# Patient Record
Sex: Male | Born: 1945 | Hispanic: No | Marital: Married
Health system: Southern US, Community
[De-identification: ages and names within clinical notes are randomized; demographics above are authoritative.]

---

## 2006-02-16 ENCOUNTER — Ambulatory Visit: Payer: Self-pay | Admitting: Cardiology

## 2006-02-16 ENCOUNTER — Ambulatory Visit (HOSPITAL_COMMUNITY): Admission: RE | Admit: 2006-02-16 | Discharge: 2006-02-16 | Payer: Self-pay | Admitting: Cardiology

## 2011-09-09 DIAGNOSIS — I499 Cardiac arrhythmia, unspecified: Secondary | ICD-10-CM | POA: Diagnosis not present

## 2011-09-09 DIAGNOSIS — Z23 Encounter for immunization: Secondary | ICD-10-CM | POA: Diagnosis not present

## 2011-09-09 DIAGNOSIS — R9431 Abnormal electrocardiogram [ECG] [EKG]: Secondary | ICD-10-CM | POA: Diagnosis not present

## 2011-09-24 DIAGNOSIS — Z23 Encounter for immunization: Secondary | ICD-10-CM | POA: Diagnosis not present

## 2011-09-24 DIAGNOSIS — E782 Mixed hyperlipidemia: Secondary | ICD-10-CM | POA: Diagnosis not present

## 2011-09-24 DIAGNOSIS — I2510 Atherosclerotic heart disease of native coronary artery without angina pectoris: Secondary | ICD-10-CM | POA: Diagnosis not present

## 2011-09-24 DIAGNOSIS — K2100 Gastro-esophageal reflux disease with esophagitis, without bleeding: Secondary | ICD-10-CM | POA: Diagnosis not present

## 2011-09-24 DIAGNOSIS — I428 Other cardiomyopathies: Secondary | ICD-10-CM | POA: Diagnosis not present

## 2011-10-21 DIAGNOSIS — H60509 Unspecified acute noninfective otitis externa, unspecified ear: Secondary | ICD-10-CM | POA: Diagnosis not present

## 2012-01-20 DIAGNOSIS — H6120 Impacted cerumen, unspecified ear: Secondary | ICD-10-CM | POA: Diagnosis not present

## 2012-01-20 DIAGNOSIS — Z1331 Encounter for screening for depression: Secondary | ICD-10-CM | POA: Diagnosis not present

## 2012-01-20 DIAGNOSIS — H9319 Tinnitus, unspecified ear: Secondary | ICD-10-CM | POA: Diagnosis not present

## 2012-01-20 DIAGNOSIS — H60509 Unspecified acute noninfective otitis externa, unspecified ear: Secondary | ICD-10-CM | POA: Diagnosis not present

## 2012-01-25 DIAGNOSIS — E782 Mixed hyperlipidemia: Secondary | ICD-10-CM | POA: Diagnosis not present

## 2012-01-25 DIAGNOSIS — I428 Other cardiomyopathies: Secondary | ICD-10-CM | POA: Diagnosis not present

## 2012-02-11 DIAGNOSIS — R7401 Elevation of levels of liver transaminase levels: Secondary | ICD-10-CM | POA: Diagnosis not present

## 2012-03-22 DIAGNOSIS — R062 Wheezing: Secondary | ICD-10-CM | POA: Diagnosis not present

## 2012-03-22 DIAGNOSIS — I959 Hypotension, unspecified: Secondary | ICD-10-CM | POA: Diagnosis not present

## 2012-03-22 DIAGNOSIS — J209 Acute bronchitis, unspecified: Secondary | ICD-10-CM | POA: Diagnosis not present

## 2012-04-12 DIAGNOSIS — H6690 Otitis media, unspecified, unspecified ear: Secondary | ICD-10-CM | POA: Diagnosis present

## 2012-04-12 DIAGNOSIS — I2510 Atherosclerotic heart disease of native coronary artery without angina pectoris: Secondary | ICD-10-CM | POA: Diagnosis present

## 2012-04-12 DIAGNOSIS — I4949 Other premature depolarization: Secondary | ICD-10-CM | POA: Diagnosis not present

## 2012-04-12 DIAGNOSIS — Z823 Family history of stroke: Secondary | ICD-10-CM | POA: Diagnosis not present

## 2012-04-12 DIAGNOSIS — I6340 Cerebral infarction due to embolism of unspecified cerebral artery: Secondary | ICD-10-CM | POA: Diagnosis present

## 2012-04-12 DIAGNOSIS — I428 Other cardiomyopathies: Secondary | ICD-10-CM | POA: Diagnosis present

## 2012-04-12 DIAGNOSIS — E785 Hyperlipidemia, unspecified: Secondary | ICD-10-CM | POA: Diagnosis not present

## 2012-04-12 DIAGNOSIS — R059 Cough, unspecified: Secondary | ICD-10-CM | POA: Diagnosis not present

## 2012-04-12 DIAGNOSIS — I369 Nonrheumatic tricuspid valve disorder, unspecified: Secondary | ICD-10-CM | POA: Diagnosis not present

## 2012-04-12 DIAGNOSIS — R29898 Other symptoms and signs involving the musculoskeletal system: Secondary | ICD-10-CM | POA: Diagnosis not present

## 2012-04-12 DIAGNOSIS — R29810 Facial weakness: Secondary | ICD-10-CM | POA: Diagnosis present

## 2012-04-12 DIAGNOSIS — I10 Essential (primary) hypertension: Secondary | ICD-10-CM | POA: Diagnosis not present

## 2012-04-12 DIAGNOSIS — R05 Cough: Secondary | ICD-10-CM | POA: Diagnosis not present

## 2012-04-12 DIAGNOSIS — I6350 Cerebral infarction due to unspecified occlusion or stenosis of unspecified cerebral artery: Secondary | ICD-10-CM | POA: Diagnosis not present

## 2012-04-12 DIAGNOSIS — R4789 Other speech disturbances: Secondary | ICD-10-CM | POA: Diagnosis present

## 2012-04-12 DIAGNOSIS — R279 Unspecified lack of coordination: Secondary | ICD-10-CM | POA: Diagnosis present

## 2012-04-18 DIAGNOSIS — I4891 Unspecified atrial fibrillation: Secondary | ICD-10-CM | POA: Diagnosis not present

## 2012-04-18 DIAGNOSIS — I69992 Facial weakness following unspecified cerebrovascular disease: Secondary | ICD-10-CM | POA: Diagnosis not present

## 2012-04-22 DIAGNOSIS — IMO0001 Reserved for inherently not codable concepts without codable children: Secondary | ICD-10-CM | POA: Diagnosis not present

## 2012-04-22 DIAGNOSIS — I69959 Hemiplegia and hemiparesis following unspecified cerebrovascular disease affecting unspecified side: Secondary | ICD-10-CM | POA: Diagnosis not present

## 2012-04-27 DIAGNOSIS — I4891 Unspecified atrial fibrillation: Secondary | ICD-10-CM | POA: Diagnosis not present

## 2012-04-27 DIAGNOSIS — I10 Essential (primary) hypertension: Secondary | ICD-10-CM | POA: Diagnosis not present

## 2012-04-27 DIAGNOSIS — I2510 Atherosclerotic heart disease of native coronary artery without angina pectoris: Secondary | ICD-10-CM | POA: Diagnosis not present

## 2012-04-27 DIAGNOSIS — I428 Other cardiomyopathies: Secondary | ICD-10-CM | POA: Diagnosis not present

## 2012-04-27 DIAGNOSIS — I6350 Cerebral infarction due to unspecified occlusion or stenosis of unspecified cerebral artery: Secondary | ICD-10-CM | POA: Diagnosis not present

## 2012-04-28 DIAGNOSIS — K645 Perianal venous thrombosis: Secondary | ICD-10-CM | POA: Diagnosis not present

## 2012-04-28 DIAGNOSIS — H608X9 Other otitis externa, unspecified ear: Secondary | ICD-10-CM | POA: Diagnosis not present

## 2012-04-29 DIAGNOSIS — H9209 Otalgia, unspecified ear: Secondary | ICD-10-CM | POA: Diagnosis not present

## 2012-04-29 DIAGNOSIS — H9319 Tinnitus, unspecified ear: Secondary | ICD-10-CM | POA: Diagnosis not present

## 2012-04-29 DIAGNOSIS — H60399 Other infective otitis externa, unspecified ear: Secondary | ICD-10-CM | POA: Diagnosis not present

## 2012-05-03 DIAGNOSIS — H60399 Other infective otitis externa, unspecified ear: Secondary | ICD-10-CM | POA: Diagnosis not present

## 2012-05-03 DIAGNOSIS — H9190 Unspecified hearing loss, unspecified ear: Secondary | ICD-10-CM | POA: Diagnosis not present

## 2012-05-09 DIAGNOSIS — R002 Palpitations: Secondary | ICD-10-CM | POA: Diagnosis not present

## 2012-05-09 DIAGNOSIS — I499 Cardiac arrhythmia, unspecified: Secondary | ICD-10-CM | POA: Diagnosis not present

## 2012-05-09 DIAGNOSIS — I4891 Unspecified atrial fibrillation: Secondary | ICD-10-CM | POA: Diagnosis not present

## 2012-05-09 DIAGNOSIS — R791 Abnormal coagulation profile: Secondary | ICD-10-CM | POA: Diagnosis not present

## 2012-05-09 DIAGNOSIS — Z7901 Long term (current) use of anticoagulants: Secondary | ICD-10-CM | POA: Diagnosis not present

## 2012-05-09 DIAGNOSIS — D689 Coagulation defect, unspecified: Secondary | ICD-10-CM | POA: Diagnosis not present

## 2012-05-12 DIAGNOSIS — I4891 Unspecified atrial fibrillation: Secondary | ICD-10-CM | POA: Diagnosis not present

## 2012-05-12 DIAGNOSIS — I495 Sick sinus syndrome: Secondary | ICD-10-CM | POA: Diagnosis not present

## 2012-05-13 DIAGNOSIS — I2510 Atherosclerotic heart disease of native coronary artery without angina pectoris: Secondary | ICD-10-CM | POA: Diagnosis not present

## 2012-05-13 DIAGNOSIS — I4891 Unspecified atrial fibrillation: Secondary | ICD-10-CM | POA: Diagnosis not present

## 2012-05-13 DIAGNOSIS — I428 Other cardiomyopathies: Secondary | ICD-10-CM | POA: Diagnosis not present

## 2012-05-13 DIAGNOSIS — I498 Other specified cardiac arrhythmias: Secondary | ICD-10-CM | POA: Diagnosis not present

## 2012-05-18 DIAGNOSIS — I4891 Unspecified atrial fibrillation: Secondary | ICD-10-CM | POA: Diagnosis not present

## 2012-05-18 DIAGNOSIS — H903 Sensorineural hearing loss, bilateral: Secondary | ICD-10-CM | POA: Diagnosis not present

## 2012-05-18 DIAGNOSIS — H9319 Tinnitus, unspecified ear: Secondary | ICD-10-CM | POA: Diagnosis not present

## 2012-05-23 DIAGNOSIS — I4891 Unspecified atrial fibrillation: Secondary | ICD-10-CM | POA: Diagnosis not present

## 2012-05-23 DIAGNOSIS — Z23 Encounter for immunization: Secondary | ICD-10-CM | POA: Diagnosis not present

## 2012-05-30 DIAGNOSIS — I517 Cardiomegaly: Secondary | ICD-10-CM | POA: Diagnosis not present

## 2012-05-30 DIAGNOSIS — I499 Cardiac arrhythmia, unspecified: Secondary | ICD-10-CM | POA: Diagnosis not present

## 2012-06-06 DIAGNOSIS — I4891 Unspecified atrial fibrillation: Secondary | ICD-10-CM | POA: Diagnosis not present

## 2012-06-20 DIAGNOSIS — Z7901 Long term (current) use of anticoagulants: Secondary | ICD-10-CM | POA: Diagnosis not present

## 2012-06-20 DIAGNOSIS — I4891 Unspecified atrial fibrillation: Secondary | ICD-10-CM | POA: Diagnosis not present

## 2012-06-27 DIAGNOSIS — Z7901 Long term (current) use of anticoagulants: Secondary | ICD-10-CM | POA: Diagnosis not present

## 2012-06-27 DIAGNOSIS — I4891 Unspecified atrial fibrillation: Secondary | ICD-10-CM | POA: Diagnosis not present

## 2012-07-04 DIAGNOSIS — I4891 Unspecified atrial fibrillation: Secondary | ICD-10-CM | POA: Diagnosis not present

## 2012-07-04 DIAGNOSIS — I428 Other cardiomyopathies: Secondary | ICD-10-CM | POA: Diagnosis not present

## 2012-07-11 DIAGNOSIS — Z7901 Long term (current) use of anticoagulants: Secondary | ICD-10-CM | POA: Diagnosis not present

## 2012-07-11 DIAGNOSIS — I4891 Unspecified atrial fibrillation: Secondary | ICD-10-CM | POA: Diagnosis not present

## 2012-07-13 DIAGNOSIS — H9190 Unspecified hearing loss, unspecified ear: Secondary | ICD-10-CM | POA: Diagnosis not present

## 2012-07-13 DIAGNOSIS — H9319 Tinnitus, unspecified ear: Secondary | ICD-10-CM | POA: Diagnosis not present

## 2012-07-18 DIAGNOSIS — R0989 Other specified symptoms and signs involving the circulatory and respiratory systems: Secondary | ICD-10-CM | POA: Diagnosis not present

## 2012-07-18 DIAGNOSIS — R0609 Other forms of dyspnea: Secondary | ICD-10-CM | POA: Diagnosis not present

## 2012-07-18 DIAGNOSIS — I4891 Unspecified atrial fibrillation: Secondary | ICD-10-CM | POA: Diagnosis not present

## 2012-07-18 DIAGNOSIS — F411 Generalized anxiety disorder: Secondary | ICD-10-CM | POA: Diagnosis not present

## 2012-07-18 DIAGNOSIS — I10 Essential (primary) hypertension: Secondary | ICD-10-CM | POA: Diagnosis not present

## 2012-07-18 DIAGNOSIS — R0602 Shortness of breath: Secondary | ICD-10-CM | POA: Diagnosis not present

## 2012-07-18 DIAGNOSIS — R5381 Other malaise: Secondary | ICD-10-CM | POA: Diagnosis not present

## 2012-07-18 DIAGNOSIS — R5383 Other fatigue: Secondary | ICD-10-CM | POA: Diagnosis not present

## 2012-07-18 DIAGNOSIS — I509 Heart failure, unspecified: Secondary | ICD-10-CM | POA: Diagnosis not present

## 2012-07-18 DIAGNOSIS — Z7901 Long term (current) use of anticoagulants: Secondary | ICD-10-CM | POA: Diagnosis not present

## 2012-07-28 DIAGNOSIS — H903 Sensorineural hearing loss, bilateral: Secondary | ICD-10-CM | POA: Diagnosis not present

## 2012-07-28 DIAGNOSIS — H9319 Tinnitus, unspecified ear: Secondary | ICD-10-CM | POA: Diagnosis not present

## 2012-08-08 DIAGNOSIS — I4891 Unspecified atrial fibrillation: Secondary | ICD-10-CM | POA: Diagnosis not present

## 2012-08-08 DIAGNOSIS — Z7901 Long term (current) use of anticoagulants: Secondary | ICD-10-CM | POA: Diagnosis not present

## 2012-08-12 DIAGNOSIS — I4891 Unspecified atrial fibrillation: Secondary | ICD-10-CM | POA: Diagnosis not present

## 2012-08-12 DIAGNOSIS — R609 Edema, unspecified: Secondary | ICD-10-CM | POA: Diagnosis not present

## 2012-08-29 DIAGNOSIS — I4891 Unspecified atrial fibrillation: Secondary | ICD-10-CM | POA: Diagnosis not present

## 2012-08-29 DIAGNOSIS — Z7901 Long term (current) use of anticoagulants: Secondary | ICD-10-CM | POA: Diagnosis not present

## 2012-08-30 DIAGNOSIS — I4891 Unspecified atrial fibrillation: Secondary | ICD-10-CM | POA: Diagnosis not present

## 2012-09-02 DIAGNOSIS — I6350 Cerebral infarction due to unspecified occlusion or stenosis of unspecified cerebral artery: Secondary | ICD-10-CM | POA: Diagnosis not present

## 2012-10-10 DIAGNOSIS — I4891 Unspecified atrial fibrillation: Secondary | ICD-10-CM | POA: Diagnosis not present

## 2012-11-07 DIAGNOSIS — E782 Mixed hyperlipidemia: Secondary | ICD-10-CM | POA: Diagnosis not present

## 2012-11-07 DIAGNOSIS — I2510 Atherosclerotic heart disease of native coronary artery without angina pectoris: Secondary | ICD-10-CM | POA: Diagnosis not present

## 2012-11-07 DIAGNOSIS — Z7901 Long term (current) use of anticoagulants: Secondary | ICD-10-CM | POA: Diagnosis not present

## 2012-11-07 DIAGNOSIS — I4891 Unspecified atrial fibrillation: Secondary | ICD-10-CM | POA: Diagnosis not present

## 2012-12-12 DIAGNOSIS — I4891 Unspecified atrial fibrillation: Secondary | ICD-10-CM | POA: Diagnosis not present

## 2013-01-02 DIAGNOSIS — I6350 Cerebral infarction due to unspecified occlusion or stenosis of unspecified cerebral artery: Secondary | ICD-10-CM | POA: Diagnosis not present

## 2013-01-02 DIAGNOSIS — I428 Other cardiomyopathies: Secondary | ICD-10-CM | POA: Diagnosis not present

## 2013-01-02 DIAGNOSIS — I10 Essential (primary) hypertension: Secondary | ICD-10-CM | POA: Diagnosis not present

## 2013-01-02 DIAGNOSIS — I4891 Unspecified atrial fibrillation: Secondary | ICD-10-CM | POA: Diagnosis not present

## 2013-01-09 DIAGNOSIS — I4891 Unspecified atrial fibrillation: Secondary | ICD-10-CM | POA: Diagnosis not present

## 2013-02-07 DIAGNOSIS — R079 Chest pain, unspecified: Secondary | ICD-10-CM | POA: Diagnosis not present

## 2013-02-07 DIAGNOSIS — R1013 Epigastric pain: Secondary | ICD-10-CM | POA: Diagnosis not present

## 2013-02-07 DIAGNOSIS — I10 Essential (primary) hypertension: Secondary | ICD-10-CM | POA: Diagnosis not present

## 2013-02-07 DIAGNOSIS — R05 Cough: Secondary | ICD-10-CM | POA: Diagnosis not present

## 2013-02-07 DIAGNOSIS — R0602 Shortness of breath: Secondary | ICD-10-CM | POA: Diagnosis not present

## 2013-02-07 DIAGNOSIS — R0789 Other chest pain: Secondary | ICD-10-CM | POA: Diagnosis not present

## 2013-02-07 DIAGNOSIS — Z7901 Long term (current) use of anticoagulants: Secondary | ICD-10-CM | POA: Diagnosis not present

## 2013-02-07 DIAGNOSIS — I509 Heart failure, unspecified: Secondary | ICD-10-CM | POA: Diagnosis not present

## 2013-02-07 DIAGNOSIS — R059 Cough, unspecified: Secondary | ICD-10-CM | POA: Diagnosis not present

## 2013-02-07 DIAGNOSIS — R7401 Elevation of levels of liver transaminase levels: Secondary | ICD-10-CM | POA: Diagnosis not present

## 2013-02-07 DIAGNOSIS — R1011 Right upper quadrant pain: Secondary | ICD-10-CM | POA: Diagnosis not present

## 2013-02-07 DIAGNOSIS — I4891 Unspecified atrial fibrillation: Secondary | ICD-10-CM | POA: Diagnosis not present

## 2013-02-07 DIAGNOSIS — Z79899 Other long term (current) drug therapy: Secondary | ICD-10-CM | POA: Diagnosis not present

## 2013-02-07 DIAGNOSIS — Z8673 Personal history of transient ischemic attack (TIA), and cerebral infarction without residual deficits: Secondary | ICD-10-CM | POA: Diagnosis not present

## 2013-02-09 DIAGNOSIS — K819 Cholecystitis, unspecified: Secondary | ICD-10-CM | POA: Diagnosis not present

## 2013-02-09 DIAGNOSIS — I4891 Unspecified atrial fibrillation: Secondary | ICD-10-CM | POA: Diagnosis not present

## 2013-02-09 DIAGNOSIS — I509 Heart failure, unspecified: Secondary | ICD-10-CM | POA: Diagnosis not present

## 2013-02-11 DIAGNOSIS — R0789 Other chest pain: Secondary | ICD-10-CM | POA: Diagnosis not present

## 2013-02-11 DIAGNOSIS — I4891 Unspecified atrial fibrillation: Secondary | ICD-10-CM | POA: Diagnosis not present

## 2013-02-11 DIAGNOSIS — I509 Heart failure, unspecified: Secondary | ICD-10-CM | POA: Diagnosis not present

## 2013-02-11 DIAGNOSIS — K8020 Calculus of gallbladder without cholecystitis without obstruction: Secondary | ICD-10-CM | POA: Diagnosis present

## 2013-02-11 DIAGNOSIS — Z8673 Personal history of transient ischemic attack (TIA), and cerebral infarction without residual deficits: Secondary | ICD-10-CM | POA: Diagnosis not present

## 2013-02-11 DIAGNOSIS — R0989 Other specified symptoms and signs involving the circulatory and respiratory systems: Secondary | ICD-10-CM | POA: Diagnosis present

## 2013-02-11 DIAGNOSIS — Z7901 Long term (current) use of anticoagulants: Secondary | ICD-10-CM | POA: Diagnosis not present

## 2013-02-11 DIAGNOSIS — I4949 Other premature depolarization: Secondary | ICD-10-CM | POA: Diagnosis not present

## 2013-02-11 DIAGNOSIS — R1013 Epigastric pain: Secondary | ICD-10-CM | POA: Diagnosis present

## 2013-02-11 DIAGNOSIS — R079 Chest pain, unspecified: Secondary | ICD-10-CM | POA: Diagnosis not present

## 2013-02-11 DIAGNOSIS — I10 Essential (primary) hypertension: Secondary | ICD-10-CM | POA: Diagnosis not present

## 2013-02-11 DIAGNOSIS — I428 Other cardiomyopathies: Secondary | ICD-10-CM | POA: Diagnosis present

## 2013-02-11 DIAGNOSIS — R0609 Other forms of dyspnea: Secondary | ICD-10-CM | POA: Diagnosis not present

## 2013-02-11 DIAGNOSIS — E785 Hyperlipidemia, unspecified: Secondary | ICD-10-CM | POA: Diagnosis present

## 2013-02-11 DIAGNOSIS — R791 Abnormal coagulation profile: Secondary | ICD-10-CM | POA: Diagnosis present

## 2013-02-11 DIAGNOSIS — I5023 Acute on chronic systolic (congestive) heart failure: Secondary | ICD-10-CM | POA: Diagnosis not present

## 2013-02-11 DIAGNOSIS — K8050 Calculus of bile duct without cholangitis or cholecystitis without obstruction: Secondary | ICD-10-CM | POA: Diagnosis not present

## 2013-02-20 DIAGNOSIS — I428 Other cardiomyopathies: Secondary | ICD-10-CM | POA: Diagnosis not present

## 2013-02-20 DIAGNOSIS — K8020 Calculus of gallbladder without cholecystitis without obstruction: Secondary | ICD-10-CM | POA: Diagnosis not present

## 2013-02-20 DIAGNOSIS — I4891 Unspecified atrial fibrillation: Secondary | ICD-10-CM | POA: Diagnosis not present

## 2013-02-21 DIAGNOSIS — R6881 Early satiety: Secondary | ICD-10-CM | POA: Diagnosis not present

## 2013-02-21 DIAGNOSIS — I2510 Atherosclerotic heart disease of native coronary artery without angina pectoris: Secondary | ICD-10-CM | POA: Diagnosis not present

## 2013-02-21 DIAGNOSIS — R1013 Epigastric pain: Secondary | ICD-10-CM | POA: Diagnosis not present

## 2013-02-23 DIAGNOSIS — R1013 Epigastric pain: Secondary | ICD-10-CM | POA: Diagnosis not present

## 2013-02-23 DIAGNOSIS — R0602 Shortness of breath: Secondary | ICD-10-CM | POA: Diagnosis not present

## 2013-02-23 DIAGNOSIS — K449 Diaphragmatic hernia without obstruction or gangrene: Secondary | ICD-10-CM | POA: Diagnosis not present

## 2013-02-23 DIAGNOSIS — J90 Pleural effusion, not elsewhere classified: Secondary | ICD-10-CM | POA: Diagnosis not present

## 2013-03-01 DIAGNOSIS — I4891 Unspecified atrial fibrillation: Secondary | ICD-10-CM | POA: Diagnosis not present

## 2013-03-02 DIAGNOSIS — K811 Chronic cholecystitis: Secondary | ICD-10-CM | POA: Diagnosis not present

## 2013-03-02 DIAGNOSIS — R1013 Epigastric pain: Secondary | ICD-10-CM | POA: Diagnosis not present

## 2013-03-02 DIAGNOSIS — I2510 Atherosclerotic heart disease of native coronary artery without angina pectoris: Secondary | ICD-10-CM | POA: Diagnosis not present

## 2013-03-03 DIAGNOSIS — R9389 Abnormal findings on diagnostic imaging of other specified body structures: Secondary | ICD-10-CM | POA: Diagnosis not present

## 2013-03-03 DIAGNOSIS — I359 Nonrheumatic aortic valve disorder, unspecified: Secondary | ICD-10-CM | POA: Diagnosis not present

## 2013-03-03 DIAGNOSIS — I079 Rheumatic tricuspid valve disease, unspecified: Secondary | ICD-10-CM | POA: Diagnosis not present

## 2013-03-03 DIAGNOSIS — I059 Rheumatic mitral valve disease, unspecified: Secondary | ICD-10-CM | POA: Diagnosis not present

## 2013-03-03 DIAGNOSIS — Z01810 Encounter for preprocedural cardiovascular examination: Secondary | ICD-10-CM | POA: Diagnosis not present

## 2013-03-08 DIAGNOSIS — I509 Heart failure, unspecified: Secondary | ICD-10-CM | POA: Diagnosis not present

## 2013-03-08 DIAGNOSIS — I4891 Unspecified atrial fibrillation: Secondary | ICD-10-CM | POA: Diagnosis not present

## 2013-03-08 DIAGNOSIS — F3289 Other specified depressive episodes: Secondary | ICD-10-CM | POA: Diagnosis not present

## 2013-03-08 DIAGNOSIS — Z139 Encounter for screening, unspecified: Secondary | ICD-10-CM | POA: Diagnosis not present

## 2013-03-08 DIAGNOSIS — F329 Major depressive disorder, single episode, unspecified: Secondary | ICD-10-CM | POA: Diagnosis not present

## 2013-03-10 DIAGNOSIS — I509 Heart failure, unspecified: Secondary | ICD-10-CM | POA: Diagnosis not present

## 2013-03-10 DIAGNOSIS — I4891 Unspecified atrial fibrillation: Secondary | ICD-10-CM | POA: Diagnosis not present

## 2013-03-15 DIAGNOSIS — J90 Pleural effusion, not elsewhere classified: Secondary | ICD-10-CM | POA: Diagnosis not present

## 2013-03-15 DIAGNOSIS — I4891 Unspecified atrial fibrillation: Secondary | ICD-10-CM | POA: Diagnosis not present

## 2013-03-15 DIAGNOSIS — R0602 Shortness of breath: Secondary | ICD-10-CM | POA: Diagnosis not present

## 2013-03-17 DIAGNOSIS — I4949 Other premature depolarization: Secondary | ICD-10-CM | POA: Diagnosis not present

## 2013-03-17 DIAGNOSIS — I428 Other cardiomyopathies: Secondary | ICD-10-CM | POA: Diagnosis not present

## 2013-03-17 DIAGNOSIS — I509 Heart failure, unspecified: Secondary | ICD-10-CM | POA: Diagnosis not present

## 2013-03-17 DIAGNOSIS — I5042 Chronic combined systolic (congestive) and diastolic (congestive) heart failure: Secondary | ICD-10-CM | POA: Diagnosis not present

## 2013-03-17 DIAGNOSIS — R0602 Shortness of breath: Secondary | ICD-10-CM | POA: Diagnosis not present

## 2013-03-23 DIAGNOSIS — J189 Pneumonia, unspecified organism: Secondary | ICD-10-CM | POA: Diagnosis not present

## 2013-03-23 DIAGNOSIS — I4891 Unspecified atrial fibrillation: Secondary | ICD-10-CM | POA: Diagnosis not present

## 2013-03-30 DIAGNOSIS — J189 Pneumonia, unspecified organism: Secondary | ICD-10-CM | POA: Diagnosis not present

## 2013-03-30 DIAGNOSIS — I4891 Unspecified atrial fibrillation: Secondary | ICD-10-CM | POA: Diagnosis not present

## 2013-03-31 DIAGNOSIS — I4949 Other premature depolarization: Secondary | ICD-10-CM | POA: Diagnosis not present

## 2013-03-31 DIAGNOSIS — I509 Heart failure, unspecified: Secondary | ICD-10-CM | POA: Diagnosis not present

## 2013-03-31 DIAGNOSIS — I5042 Chronic combined systolic (congestive) and diastolic (congestive) heart failure: Secondary | ICD-10-CM | POA: Diagnosis not present

## 2013-03-31 DIAGNOSIS — R0602 Shortness of breath: Secondary | ICD-10-CM | POA: Diagnosis not present

## 2013-03-31 DIAGNOSIS — I428 Other cardiomyopathies: Secondary | ICD-10-CM | POA: Diagnosis not present

## 2013-04-03 DIAGNOSIS — J342 Deviated nasal septum: Secondary | ICD-10-CM | POA: Diagnosis not present

## 2013-04-03 DIAGNOSIS — H9190 Unspecified hearing loss, unspecified ear: Secondary | ICD-10-CM | POA: Diagnosis not present

## 2013-04-03 DIAGNOSIS — H9319 Tinnitus, unspecified ear: Secondary | ICD-10-CM | POA: Diagnosis not present

## 2013-04-03 DIAGNOSIS — H6120 Impacted cerumen, unspecified ear: Secondary | ICD-10-CM | POA: Diagnosis not present

## 2013-04-20 DIAGNOSIS — I4949 Other premature depolarization: Secondary | ICD-10-CM | POA: Diagnosis not present

## 2013-04-20 DIAGNOSIS — R0602 Shortness of breath: Secondary | ICD-10-CM | POA: Diagnosis not present

## 2013-04-20 DIAGNOSIS — I509 Heart failure, unspecified: Secondary | ICD-10-CM | POA: Diagnosis not present

## 2013-04-20 DIAGNOSIS — I428 Other cardiomyopathies: Secondary | ICD-10-CM | POA: Diagnosis not present

## 2013-04-20 DIAGNOSIS — I5042 Chronic combined systolic (congestive) and diastolic (congestive) heart failure: Secondary | ICD-10-CM | POA: Diagnosis not present

## 2013-05-08 DIAGNOSIS — F3289 Other specified depressive episodes: Secondary | ICD-10-CM | POA: Diagnosis not present

## 2013-05-08 DIAGNOSIS — E782 Mixed hyperlipidemia: Secondary | ICD-10-CM | POA: Diagnosis not present

## 2013-05-08 DIAGNOSIS — F329 Major depressive disorder, single episode, unspecified: Secondary | ICD-10-CM | POA: Diagnosis not present

## 2013-05-08 DIAGNOSIS — I4891 Unspecified atrial fibrillation: Secondary | ICD-10-CM | POA: Diagnosis not present

## 2013-05-08 DIAGNOSIS — Z1389 Encounter for screening for other disorder: Secondary | ICD-10-CM | POA: Diagnosis not present

## 2013-05-10 DIAGNOSIS — R7989 Other specified abnormal findings of blood chemistry: Secondary | ICD-10-CM | POA: Diagnosis not present

## 2013-05-10 DIAGNOSIS — K819 Cholecystitis, unspecified: Secondary | ICD-10-CM | POA: Diagnosis not present

## 2013-05-11 DIAGNOSIS — H905 Unspecified sensorineural hearing loss: Secondary | ICD-10-CM | POA: Diagnosis not present

## 2013-05-11 DIAGNOSIS — H9319 Tinnitus, unspecified ear: Secondary | ICD-10-CM | POA: Diagnosis not present

## 2013-05-11 DIAGNOSIS — H903 Sensorineural hearing loss, bilateral: Secondary | ICD-10-CM | POA: Diagnosis not present

## 2013-05-15 DIAGNOSIS — I4891 Unspecified atrial fibrillation: Secondary | ICD-10-CM | POA: Diagnosis not present

## 2013-05-20 DIAGNOSIS — R7989 Other specified abnormal findings of blood chemistry: Secondary | ICD-10-CM | POA: Diagnosis not present

## 2013-05-20 DIAGNOSIS — K862 Cyst of pancreas: Secondary | ICD-10-CM | POA: Diagnosis not present

## 2013-05-29 DIAGNOSIS — I10 Essential (primary) hypertension: Secondary | ICD-10-CM | POA: Diagnosis not present

## 2013-05-29 DIAGNOSIS — I4891 Unspecified atrial fibrillation: Secondary | ICD-10-CM | POA: Diagnosis not present

## 2013-05-29 DIAGNOSIS — I428 Other cardiomyopathies: Secondary | ICD-10-CM | POA: Diagnosis not present

## 2013-05-29 DIAGNOSIS — I509 Heart failure, unspecified: Secondary | ICD-10-CM | POA: Diagnosis not present

## 2013-05-29 DIAGNOSIS — I2510 Atherosclerotic heart disease of native coronary artery without angina pectoris: Secondary | ICD-10-CM | POA: Diagnosis not present

## 2013-06-02 DIAGNOSIS — R7989 Other specified abnormal findings of blood chemistry: Secondary | ICD-10-CM | POA: Diagnosis not present

## 2013-06-02 DIAGNOSIS — K828 Other specified diseases of gallbladder: Secondary | ICD-10-CM | POA: Diagnosis not present

## 2013-06-07 DIAGNOSIS — F3289 Other specified depressive episodes: Secondary | ICD-10-CM | POA: Diagnosis not present

## 2013-06-07 DIAGNOSIS — I4891 Unspecified atrial fibrillation: Secondary | ICD-10-CM | POA: Diagnosis not present

## 2013-06-07 DIAGNOSIS — I509 Heart failure, unspecified: Secondary | ICD-10-CM | POA: Diagnosis not present

## 2013-06-07 DIAGNOSIS — F329 Major depressive disorder, single episode, unspecified: Secondary | ICD-10-CM | POA: Diagnosis not present

## 2013-06-07 DIAGNOSIS — I2510 Atherosclerotic heart disease of native coronary artery without angina pectoris: Secondary | ICD-10-CM | POA: Diagnosis not present

## 2013-06-13 DIAGNOSIS — R0602 Shortness of breath: Secondary | ICD-10-CM | POA: Diagnosis not present

## 2013-06-13 DIAGNOSIS — I509 Heart failure, unspecified: Secondary | ICD-10-CM | POA: Diagnosis not present

## 2013-06-13 DIAGNOSIS — I428 Other cardiomyopathies: Secondary | ICD-10-CM | POA: Diagnosis not present

## 2013-06-13 DIAGNOSIS — I4949 Other premature depolarization: Secondary | ICD-10-CM | POA: Diagnosis not present

## 2013-06-13 DIAGNOSIS — I5042 Chronic combined systolic (congestive) and diastolic (congestive) heart failure: Secondary | ICD-10-CM | POA: Diagnosis not present

## 2013-06-14 DIAGNOSIS — I4891 Unspecified atrial fibrillation: Secondary | ICD-10-CM | POA: Diagnosis not present

## 2013-06-16 DIAGNOSIS — C259 Malignant neoplasm of pancreas, unspecified: Secondary | ICD-10-CM | POA: Diagnosis not present

## 2013-06-21 DIAGNOSIS — I4891 Unspecified atrial fibrillation: Secondary | ICD-10-CM | POA: Diagnosis not present

## 2013-06-21 DIAGNOSIS — R0602 Shortness of breath: Secondary | ICD-10-CM | POA: Diagnosis not present

## 2013-06-21 DIAGNOSIS — I4949 Other premature depolarization: Secondary | ICD-10-CM | POA: Diagnosis not present

## 2013-06-21 DIAGNOSIS — I5042 Chronic combined systolic (congestive) and diastolic (congestive) heart failure: Secondary | ICD-10-CM | POA: Diagnosis not present

## 2013-07-03 DIAGNOSIS — I428 Other cardiomyopathies: Secondary | ICD-10-CM | POA: Diagnosis not present

## 2013-07-03 DIAGNOSIS — K8050 Calculus of bile duct without cholangitis or cholecystitis without obstruction: Secondary | ICD-10-CM | POA: Diagnosis not present

## 2013-07-03 DIAGNOSIS — D499 Neoplasm of unspecified behavior of unspecified site: Secondary | ICD-10-CM | POA: Diagnosis not present

## 2013-07-03 DIAGNOSIS — K862 Cyst of pancreas: Secondary | ICD-10-CM | POA: Diagnosis not present

## 2013-07-03 DIAGNOSIS — D379 Neoplasm of uncertain behavior of digestive organ, unspecified: Secondary | ICD-10-CM | POA: Diagnosis not present

## 2013-07-05 DIAGNOSIS — I4891 Unspecified atrial fibrillation: Secondary | ICD-10-CM | POA: Diagnosis not present

## 2013-07-10 DIAGNOSIS — I4891 Unspecified atrial fibrillation: Secondary | ICD-10-CM | POA: Diagnosis not present

## 2013-07-12 DIAGNOSIS — J342 Deviated nasal septum: Secondary | ICD-10-CM | POA: Diagnosis not present

## 2013-07-12 DIAGNOSIS — H9190 Unspecified hearing loss, unspecified ear: Secondary | ICD-10-CM | POA: Diagnosis not present

## 2013-07-12 DIAGNOSIS — H9319 Tinnitus, unspecified ear: Secondary | ICD-10-CM | POA: Diagnosis not present

## 2013-07-17 DIAGNOSIS — I4891 Unspecified atrial fibrillation: Secondary | ICD-10-CM | POA: Diagnosis not present

## 2013-07-18 DIAGNOSIS — N644 Mastodynia: Secondary | ICD-10-CM | POA: Diagnosis not present

## 2013-07-18 DIAGNOSIS — Z6826 Body mass index (BMI) 26.0-26.9, adult: Secondary | ICD-10-CM | POA: Diagnosis not present

## 2013-07-18 DIAGNOSIS — Z1389 Encounter for screening for other disorder: Secondary | ICD-10-CM | POA: Diagnosis not present

## 2013-07-31 DIAGNOSIS — R17 Unspecified jaundice: Secondary | ICD-10-CM | POA: Diagnosis not present

## 2013-07-31 DIAGNOSIS — K8050 Calculus of bile duct without cholangitis or cholecystitis without obstruction: Secondary | ICD-10-CM | POA: Diagnosis not present

## 2013-07-31 DIAGNOSIS — K862 Cyst of pancreas: Secondary | ICD-10-CM | POA: Diagnosis not present

## 2013-07-31 DIAGNOSIS — R21 Rash and other nonspecific skin eruption: Secondary | ICD-10-CM | POA: Diagnosis not present

## 2013-07-31 DIAGNOSIS — I428 Other cardiomyopathies: Secondary | ICD-10-CM | POA: Diagnosis not present

## 2013-07-31 DIAGNOSIS — N644 Mastodynia: Secondary | ICD-10-CM | POA: Diagnosis not present

## 2013-08-03 DIAGNOSIS — N62 Hypertrophy of breast: Secondary | ICD-10-CM | POA: Diagnosis not present

## 2013-08-03 DIAGNOSIS — N63 Unspecified lump in unspecified breast: Secondary | ICD-10-CM | POA: Diagnosis not present

## 2013-08-09 DIAGNOSIS — I428 Other cardiomyopathies: Secondary | ICD-10-CM | POA: Diagnosis not present

## 2013-08-10 DIAGNOSIS — I428 Other cardiomyopathies: Secondary | ICD-10-CM | POA: Diagnosis not present

## 2013-08-10 DIAGNOSIS — I4949 Other premature depolarization: Secondary | ICD-10-CM | POA: Diagnosis not present

## 2013-08-10 DIAGNOSIS — R0602 Shortness of breath: Secondary | ICD-10-CM | POA: Diagnosis not present

## 2013-08-10 DIAGNOSIS — I509 Heart failure, unspecified: Secondary | ICD-10-CM | POA: Diagnosis not present

## 2013-08-10 DIAGNOSIS — I5042 Chronic combined systolic (congestive) and diastolic (congestive) heart failure: Secondary | ICD-10-CM | POA: Diagnosis not present

## 2013-08-23 DIAGNOSIS — R0602 Shortness of breath: Secondary | ICD-10-CM | POA: Diagnosis not present

## 2013-08-23 DIAGNOSIS — I4949 Other premature depolarization: Secondary | ICD-10-CM | POA: Diagnosis not present

## 2013-08-23 DIAGNOSIS — R0789 Other chest pain: Secondary | ICD-10-CM | POA: Diagnosis not present

## 2013-09-07 DIAGNOSIS — I4949 Other premature depolarization: Secondary | ICD-10-CM | POA: Diagnosis not present

## 2013-09-07 DIAGNOSIS — I10 Essential (primary) hypertension: Secondary | ICD-10-CM | POA: Diagnosis not present

## 2013-09-07 DIAGNOSIS — I428 Other cardiomyopathies: Secondary | ICD-10-CM | POA: Diagnosis not present

## 2013-09-07 DIAGNOSIS — I509 Heart failure, unspecified: Secondary | ICD-10-CM | POA: Diagnosis not present

## 2013-09-07 DIAGNOSIS — I5042 Chronic combined systolic (congestive) and diastolic (congestive) heart failure: Secondary | ICD-10-CM | POA: Diagnosis not present

## 2013-09-15 DIAGNOSIS — I6350 Cerebral infarction due to unspecified occlusion or stenosis of unspecified cerebral artery: Secondary | ICD-10-CM | POA: Diagnosis not present

## 2013-09-19 DIAGNOSIS — I4891 Unspecified atrial fibrillation: Secondary | ICD-10-CM | POA: Diagnosis not present

## 2013-10-02 DIAGNOSIS — K862 Cyst of pancreas: Secondary | ICD-10-CM | POA: Diagnosis not present

## 2013-10-09 DIAGNOSIS — D379 Neoplasm of uncertain behavior of digestive organ, unspecified: Secondary | ICD-10-CM | POA: Diagnosis not present

## 2013-10-09 DIAGNOSIS — K863 Pseudocyst of pancreas: Secondary | ICD-10-CM | POA: Diagnosis not present

## 2013-10-09 DIAGNOSIS — K862 Cyst of pancreas: Secondary | ICD-10-CM | POA: Diagnosis not present

## 2013-10-09 DIAGNOSIS — D499 Neoplasm of unspecified behavior of unspecified site: Secondary | ICD-10-CM | POA: Diagnosis not present

## 2013-10-23 DIAGNOSIS — I4891 Unspecified atrial fibrillation: Secondary | ICD-10-CM | POA: Diagnosis not present

## 2013-10-23 DIAGNOSIS — Z7901 Long term (current) use of anticoagulants: Secondary | ICD-10-CM | POA: Diagnosis not present

## 2013-10-25 DIAGNOSIS — I4891 Unspecified atrial fibrillation: Secondary | ICD-10-CM | POA: Diagnosis not present

## 2013-10-25 DIAGNOSIS — Z7901 Long term (current) use of anticoagulants: Secondary | ICD-10-CM | POA: Diagnosis not present

## 2013-10-25 DIAGNOSIS — H6120 Impacted cerumen, unspecified ear: Secondary | ICD-10-CM | POA: Diagnosis not present

## 2013-11-01 DIAGNOSIS — M25569 Pain in unspecified knee: Secondary | ICD-10-CM | POA: Diagnosis not present

## 2013-11-01 DIAGNOSIS — Z7901 Long term (current) use of anticoagulants: Secondary | ICD-10-CM | POA: Diagnosis not present

## 2013-11-01 DIAGNOSIS — I4891 Unspecified atrial fibrillation: Secondary | ICD-10-CM | POA: Diagnosis not present

## 2013-11-01 DIAGNOSIS — Z6828 Body mass index (BMI) 28.0-28.9, adult: Secondary | ICD-10-CM | POA: Diagnosis not present

## 2013-11-02 DIAGNOSIS — IMO0002 Reserved for concepts with insufficient information to code with codable children: Secondary | ICD-10-CM | POA: Diagnosis not present

## 2013-11-02 DIAGNOSIS — M1710 Unilateral primary osteoarthritis, unspecified knee: Secondary | ICD-10-CM | POA: Diagnosis not present

## 2013-11-13 DIAGNOSIS — S43499A Other sprain of unspecified shoulder joint, initial encounter: Secondary | ICD-10-CM | POA: Diagnosis not present

## 2013-11-13 DIAGNOSIS — I4891 Unspecified atrial fibrillation: Secondary | ICD-10-CM | POA: Diagnosis not present

## 2013-11-13 DIAGNOSIS — Z7901 Long term (current) use of anticoagulants: Secondary | ICD-10-CM | POA: Diagnosis not present

## 2013-11-13 DIAGNOSIS — S46819A Strain of other muscles, fascia and tendons at shoulder and upper arm level, unspecified arm, initial encounter: Secondary | ICD-10-CM | POA: Diagnosis not present

## 2013-11-13 DIAGNOSIS — Z6828 Body mass index (BMI) 28.0-28.9, adult: Secondary | ICD-10-CM | POA: Diagnosis not present

## 2013-11-13 DIAGNOSIS — M25569 Pain in unspecified knee: Secondary | ICD-10-CM | POA: Diagnosis not present

## 2013-11-15 DIAGNOSIS — R937 Abnormal findings on diagnostic imaging of other parts of musculoskeletal system: Secondary | ICD-10-CM | POA: Diagnosis not present

## 2013-11-15 DIAGNOSIS — M79609 Pain in unspecified limb: Secondary | ICD-10-CM | POA: Diagnosis not present

## 2013-11-15 DIAGNOSIS — M25569 Pain in unspecified knee: Secondary | ICD-10-CM | POA: Diagnosis not present

## 2013-11-20 DIAGNOSIS — I4891 Unspecified atrial fibrillation: Secondary | ICD-10-CM | POA: Diagnosis not present

## 2013-11-20 DIAGNOSIS — S43499A Other sprain of unspecified shoulder joint, initial encounter: Secondary | ICD-10-CM | POA: Diagnosis not present

## 2013-11-27 DIAGNOSIS — M62838 Other muscle spasm: Secondary | ICD-10-CM | POA: Diagnosis not present

## 2013-11-27 DIAGNOSIS — M546 Pain in thoracic spine: Secondary | ICD-10-CM | POA: Diagnosis not present

## 2013-11-27 DIAGNOSIS — M259 Joint disorder, unspecified: Secondary | ICD-10-CM | POA: Diagnosis not present

## 2013-11-29 DIAGNOSIS — M62838 Other muscle spasm: Secondary | ICD-10-CM | POA: Diagnosis not present

## 2013-11-29 DIAGNOSIS — M546 Pain in thoracic spine: Secondary | ICD-10-CM | POA: Diagnosis not present

## 2013-11-29 DIAGNOSIS — M259 Joint disorder, unspecified: Secondary | ICD-10-CM | POA: Diagnosis not present

## 2013-12-04 DIAGNOSIS — R0602 Shortness of breath: Secondary | ICD-10-CM | POA: Diagnosis not present

## 2013-12-04 DIAGNOSIS — I428 Other cardiomyopathies: Secondary | ICD-10-CM | POA: Diagnosis not present

## 2013-12-04 DIAGNOSIS — M62838 Other muscle spasm: Secondary | ICD-10-CM | POA: Diagnosis not present

## 2013-12-04 DIAGNOSIS — I5042 Chronic combined systolic (congestive) and diastolic (congestive) heart failure: Secondary | ICD-10-CM | POA: Diagnosis not present

## 2013-12-04 DIAGNOSIS — M546 Pain in thoracic spine: Secondary | ICD-10-CM | POA: Diagnosis not present

## 2013-12-04 DIAGNOSIS — I509 Heart failure, unspecified: Secondary | ICD-10-CM | POA: Diagnosis not present

## 2013-12-04 DIAGNOSIS — M259 Joint disorder, unspecified: Secondary | ICD-10-CM | POA: Diagnosis not present

## 2013-12-22 DIAGNOSIS — H6120 Impacted cerumen, unspecified ear: Secondary | ICD-10-CM | POA: Diagnosis not present

## 2013-12-22 DIAGNOSIS — J342 Deviated nasal septum: Secondary | ICD-10-CM | POA: Diagnosis not present

## 2013-12-22 DIAGNOSIS — H9319 Tinnitus, unspecified ear: Secondary | ICD-10-CM | POA: Diagnosis not present

## 2013-12-22 DIAGNOSIS — H9190 Unspecified hearing loss, unspecified ear: Secondary | ICD-10-CM | POA: Diagnosis not present

## 2014-02-05 DIAGNOSIS — E782 Mixed hyperlipidemia: Secondary | ICD-10-CM | POA: Diagnosis not present

## 2014-02-05 DIAGNOSIS — I2510 Atherosclerotic heart disease of native coronary artery without angina pectoris: Secondary | ICD-10-CM | POA: Diagnosis not present

## 2014-02-05 DIAGNOSIS — Z7901 Long term (current) use of anticoagulants: Secondary | ICD-10-CM | POA: Diagnosis not present

## 2014-02-05 DIAGNOSIS — I4891 Unspecified atrial fibrillation: Secondary | ICD-10-CM | POA: Diagnosis not present

## 2014-02-05 DIAGNOSIS — I509 Heart failure, unspecified: Secondary | ICD-10-CM | POA: Diagnosis not present

## 2014-02-12 DIAGNOSIS — I4891 Unspecified atrial fibrillation: Secondary | ICD-10-CM | POA: Diagnosis not present

## 2014-02-12 DIAGNOSIS — Z7901 Long term (current) use of anticoagulants: Secondary | ICD-10-CM | POA: Diagnosis not present

## 2014-03-11 DIAGNOSIS — J069 Acute upper respiratory infection, unspecified: Secondary | ICD-10-CM | POA: Diagnosis not present

## 2014-03-11 DIAGNOSIS — R110 Nausea: Secondary | ICD-10-CM | POA: Diagnosis not present

## 2014-03-11 DIAGNOSIS — R05 Cough: Secondary | ICD-10-CM | POA: Diagnosis not present

## 2014-03-11 DIAGNOSIS — R059 Cough, unspecified: Secondary | ICD-10-CM | POA: Diagnosis not present

## 2014-03-20 DIAGNOSIS — I4891 Unspecified atrial fibrillation: Secondary | ICD-10-CM | POA: Diagnosis not present

## 2014-03-20 DIAGNOSIS — Z7901 Long term (current) use of anticoagulants: Secondary | ICD-10-CM | POA: Diagnosis not present

## 2014-04-23 DIAGNOSIS — I4891 Unspecified atrial fibrillation: Secondary | ICD-10-CM | POA: Diagnosis not present

## 2014-04-23 DIAGNOSIS — Z7901 Long term (current) use of anticoagulants: Secondary | ICD-10-CM | POA: Diagnosis not present

## 2014-05-30 DIAGNOSIS — Z23 Encounter for immunization: Secondary | ICD-10-CM | POA: Diagnosis not present

## 2014-05-30 DIAGNOSIS — I482 Chronic atrial fibrillation: Secondary | ICD-10-CM | POA: Diagnosis not present

## 2014-06-01 DIAGNOSIS — I4891 Unspecified atrial fibrillation: Secondary | ICD-10-CM | POA: Diagnosis not present

## 2014-06-01 DIAGNOSIS — I2510 Atherosclerotic heart disease of native coronary artery without angina pectoris: Secondary | ICD-10-CM | POA: Diagnosis not present

## 2014-06-01 DIAGNOSIS — E782 Mixed hyperlipidemia: Secondary | ICD-10-CM | POA: Diagnosis not present

## 2014-06-01 DIAGNOSIS — E785 Hyperlipidemia, unspecified: Secondary | ICD-10-CM | POA: Diagnosis not present

## 2014-06-15 DIAGNOSIS — I4891 Unspecified atrial fibrillation: Secondary | ICD-10-CM | POA: Diagnosis not present

## 2014-07-13 DIAGNOSIS — I4891 Unspecified atrial fibrillation: Secondary | ICD-10-CM | POA: Diagnosis not present

## 2014-08-15 DIAGNOSIS — Z139 Encounter for screening, unspecified: Secondary | ICD-10-CM | POA: Diagnosis not present

## 2014-08-15 DIAGNOSIS — I4891 Unspecified atrial fibrillation: Secondary | ICD-10-CM | POA: Diagnosis not present

## 2014-08-15 DIAGNOSIS — Z0000 Encounter for general adult medical examination without abnormal findings: Secondary | ICD-10-CM | POA: Diagnosis not present

## 2014-08-15 DIAGNOSIS — Z1389 Encounter for screening for other disorder: Secondary | ICD-10-CM | POA: Diagnosis not present

## 2014-09-17 DIAGNOSIS — I4891 Unspecified atrial fibrillation: Secondary | ICD-10-CM | POA: Diagnosis not present

## 2014-10-22 DIAGNOSIS — I4891 Unspecified atrial fibrillation: Secondary | ICD-10-CM | POA: Diagnosis not present

## 2014-10-29 DIAGNOSIS — Z6828 Body mass index (BMI) 28.0-28.9, adult: Secondary | ICD-10-CM | POA: Diagnosis not present

## 2014-10-29 DIAGNOSIS — L719 Rosacea, unspecified: Secondary | ICD-10-CM | POA: Diagnosis not present

## 2014-10-29 DIAGNOSIS — R0989 Other specified symptoms and signs involving the circulatory and respiratory systems: Secondary | ICD-10-CM | POA: Diagnosis not present

## 2014-11-05 DIAGNOSIS — Z8679 Personal history of other diseases of the circulatory system: Secondary | ICD-10-CM | POA: Diagnosis not present

## 2014-11-05 DIAGNOSIS — K862 Cyst of pancreas: Secondary | ICD-10-CM | POA: Diagnosis not present

## 2014-11-05 DIAGNOSIS — Z8673 Personal history of transient ischemic attack (TIA), and cerebral infarction without residual deficits: Secondary | ICD-10-CM | POA: Diagnosis not present

## 2014-11-06 DIAGNOSIS — I4891 Unspecified atrial fibrillation: Secondary | ICD-10-CM | POA: Diagnosis not present

## 2014-11-14 DIAGNOSIS — L811 Chloasma: Secondary | ICD-10-CM | POA: Diagnosis not present

## 2014-11-26 DIAGNOSIS — I4891 Unspecified atrial fibrillation: Secondary | ICD-10-CM | POA: Diagnosis not present

## 2015-01-07 DIAGNOSIS — I4891 Unspecified atrial fibrillation: Secondary | ICD-10-CM | POA: Diagnosis not present

## 2015-02-14 DIAGNOSIS — K219 Gastro-esophageal reflux disease without esophagitis: Secondary | ICD-10-CM | POA: Diagnosis not present

## 2015-02-14 DIAGNOSIS — I509 Heart failure, unspecified: Secondary | ICD-10-CM | POA: Diagnosis not present

## 2015-02-14 DIAGNOSIS — I4891 Unspecified atrial fibrillation: Secondary | ICD-10-CM | POA: Diagnosis not present

## 2015-02-14 DIAGNOSIS — I429 Cardiomyopathy, unspecified: Secondary | ICD-10-CM | POA: Diagnosis not present

## 2015-02-14 DIAGNOSIS — E785 Hyperlipidemia, unspecified: Secondary | ICD-10-CM | POA: Diagnosis not present

## 2015-04-24 DIAGNOSIS — I4891 Unspecified atrial fibrillation: Secondary | ICD-10-CM | POA: Diagnosis not present

## 2015-05-02 DIAGNOSIS — I4891 Unspecified atrial fibrillation: Secondary | ICD-10-CM | POA: Diagnosis not present

## 2015-05-16 DIAGNOSIS — R05 Cough: Secondary | ICD-10-CM | POA: Diagnosis not present

## 2015-05-16 DIAGNOSIS — I4891 Unspecified atrial fibrillation: Secondary | ICD-10-CM | POA: Diagnosis not present

## 2015-05-16 DIAGNOSIS — Z6827 Body mass index (BMI) 27.0-27.9, adult: Secondary | ICD-10-CM | POA: Diagnosis not present

## 2015-05-23 DIAGNOSIS — R05 Cough: Secondary | ICD-10-CM | POA: Diagnosis not present

## 2015-05-23 DIAGNOSIS — I4891 Unspecified atrial fibrillation: Secondary | ICD-10-CM | POA: Diagnosis not present

## 2015-05-23 DIAGNOSIS — Z6827 Body mass index (BMI) 27.0-27.9, adult: Secondary | ICD-10-CM | POA: Diagnosis not present

## 2015-06-06 DIAGNOSIS — I4891 Unspecified atrial fibrillation: Secondary | ICD-10-CM | POA: Diagnosis not present

## 2015-06-06 DIAGNOSIS — Z6827 Body mass index (BMI) 27.0-27.9, adult: Secondary | ICD-10-CM | POA: Diagnosis not present

## 2015-06-06 DIAGNOSIS — R05 Cough: Secondary | ICD-10-CM | POA: Diagnosis not present

## 2015-06-06 DIAGNOSIS — H6120 Impacted cerumen, unspecified ear: Secondary | ICD-10-CM | POA: Diagnosis not present

## 2015-06-11 DIAGNOSIS — I429 Cardiomyopathy, unspecified: Secondary | ICD-10-CM | POA: Diagnosis not present

## 2015-06-11 DIAGNOSIS — I080 Rheumatic disorders of both mitral and aortic valves: Secondary | ICD-10-CM | POA: Diagnosis not present

## 2015-06-11 DIAGNOSIS — I4891 Unspecified atrial fibrillation: Secondary | ICD-10-CM | POA: Diagnosis not present

## 2015-06-12 DIAGNOSIS — R0602 Shortness of breath: Secondary | ICD-10-CM | POA: Diagnosis not present

## 2015-06-12 DIAGNOSIS — R05 Cough: Secondary | ICD-10-CM | POA: Diagnosis not present

## 2015-06-20 DIAGNOSIS — I429 Cardiomyopathy, unspecified: Secondary | ICD-10-CM | POA: Diagnosis not present

## 2015-06-20 DIAGNOSIS — I509 Heart failure, unspecified: Secondary | ICD-10-CM | POA: Diagnosis not present

## 2015-06-20 DIAGNOSIS — I4891 Unspecified atrial fibrillation: Secondary | ICD-10-CM | POA: Diagnosis not present

## 2015-06-20 DIAGNOSIS — R05 Cough: Secondary | ICD-10-CM | POA: Diagnosis not present

## 2015-06-27 DIAGNOSIS — I5043 Acute on chronic combined systolic (congestive) and diastolic (congestive) heart failure: Secondary | ICD-10-CM | POA: Diagnosis not present

## 2015-06-27 DIAGNOSIS — I420 Dilated cardiomyopathy: Secondary | ICD-10-CM | POA: Diagnosis not present

## 2015-07-09 DIAGNOSIS — I4891 Unspecified atrial fibrillation: Secondary | ICD-10-CM | POA: Diagnosis not present

## 2015-07-11 DIAGNOSIS — I420 Dilated cardiomyopathy: Secondary | ICD-10-CM | POA: Diagnosis not present

## 2015-07-11 DIAGNOSIS — R0609 Other forms of dyspnea: Secondary | ICD-10-CM | POA: Diagnosis not present

## 2015-07-11 DIAGNOSIS — I5043 Acute on chronic combined systolic (congestive) and diastolic (congestive) heart failure: Secondary | ICD-10-CM | POA: Diagnosis not present

## 2015-07-11 DIAGNOSIS — I480 Paroxysmal atrial fibrillation: Secondary | ICD-10-CM | POA: Diagnosis not present

## 2015-07-23 DIAGNOSIS — R05 Cough: Secondary | ICD-10-CM | POA: Diagnosis not present

## 2015-07-23 DIAGNOSIS — I429 Cardiomyopathy, unspecified: Secondary | ICD-10-CM | POA: Diagnosis not present

## 2015-07-23 DIAGNOSIS — I509 Heart failure, unspecified: Secondary | ICD-10-CM | POA: Diagnosis not present

## 2015-07-23 DIAGNOSIS — I4891 Unspecified atrial fibrillation: Secondary | ICD-10-CM | POA: Diagnosis not present

## 2015-07-31 DIAGNOSIS — I5043 Acute on chronic combined systolic (congestive) and diastolic (congestive) heart failure: Secondary | ICD-10-CM | POA: Diagnosis not present

## 2015-07-31 DIAGNOSIS — I480 Paroxysmal atrial fibrillation: Secondary | ICD-10-CM | POA: Diagnosis not present

## 2015-07-31 DIAGNOSIS — R0609 Other forms of dyspnea: Secondary | ICD-10-CM | POA: Diagnosis not present

## 2015-07-31 DIAGNOSIS — I420 Dilated cardiomyopathy: Secondary | ICD-10-CM | POA: Diagnosis not present

## 2015-08-13 DIAGNOSIS — I420 Dilated cardiomyopathy: Secondary | ICD-10-CM | POA: Diagnosis not present

## 2015-08-13 DIAGNOSIS — R0609 Other forms of dyspnea: Secondary | ICD-10-CM | POA: Diagnosis not present

## 2015-08-22 DIAGNOSIS — I4891 Unspecified atrial fibrillation: Secondary | ICD-10-CM | POA: Diagnosis not present

## 2015-09-19 DIAGNOSIS — I4891 Unspecified atrial fibrillation: Secondary | ICD-10-CM | POA: Diagnosis not present

## 2015-09-23 DIAGNOSIS — I4891 Unspecified atrial fibrillation: Secondary | ICD-10-CM | POA: Diagnosis not present

## 2015-09-24 DIAGNOSIS — I5043 Acute on chronic combined systolic (congestive) and diastolic (congestive) heart failure: Secondary | ICD-10-CM | POA: Diagnosis not present

## 2015-09-24 DIAGNOSIS — R0609 Other forms of dyspnea: Secondary | ICD-10-CM | POA: Diagnosis not present

## 2015-09-24 DIAGNOSIS — R109 Unspecified abdominal pain: Secondary | ICD-10-CM | POA: Diagnosis not present

## 2015-09-24 DIAGNOSIS — I480 Paroxysmal atrial fibrillation: Secondary | ICD-10-CM | POA: Diagnosis not present

## 2015-09-24 DIAGNOSIS — I420 Dilated cardiomyopathy: Secondary | ICD-10-CM | POA: Diagnosis not present

## 2015-10-01 DIAGNOSIS — I4891 Unspecified atrial fibrillation: Secondary | ICD-10-CM | POA: Diagnosis not present

## 2015-10-04 DIAGNOSIS — R1084 Generalized abdominal pain: Secondary | ICD-10-CM | POA: Diagnosis not present

## 2015-10-04 DIAGNOSIS — R109 Unspecified abdominal pain: Secondary | ICD-10-CM | POA: Diagnosis not present

## 2015-10-15 DIAGNOSIS — I4891 Unspecified atrial fibrillation: Secondary | ICD-10-CM | POA: Diagnosis not present

## 2015-10-30 DIAGNOSIS — I5043 Acute on chronic combined systolic (congestive) and diastolic (congestive) heart failure: Secondary | ICD-10-CM | POA: Diagnosis not present

## 2015-10-30 DIAGNOSIS — R600 Localized edema: Secondary | ICD-10-CM | POA: Diagnosis not present

## 2015-10-30 DIAGNOSIS — I420 Dilated cardiomyopathy: Secondary | ICD-10-CM | POA: Diagnosis not present

## 2015-10-30 DIAGNOSIS — R0609 Other forms of dyspnea: Secondary | ICD-10-CM | POA: Diagnosis not present

## 2015-10-30 DIAGNOSIS — R109 Unspecified abdominal pain: Secondary | ICD-10-CM | POA: Diagnosis not present

## 2015-11-12 DIAGNOSIS — I4891 Unspecified atrial fibrillation: Secondary | ICD-10-CM | POA: Diagnosis not present

## 2015-12-04 DIAGNOSIS — R0609 Other forms of dyspnea: Secondary | ICD-10-CM | POA: Diagnosis not present

## 2015-12-04 DIAGNOSIS — I420 Dilated cardiomyopathy: Secondary | ICD-10-CM | POA: Diagnosis not present

## 2015-12-04 DIAGNOSIS — I480 Paroxysmal atrial fibrillation: Secondary | ICD-10-CM | POA: Diagnosis not present

## 2015-12-04 DIAGNOSIS — I5043 Acute on chronic combined systolic (congestive) and diastolic (congestive) heart failure: Secondary | ICD-10-CM | POA: Diagnosis not present

## 2015-12-04 DIAGNOSIS — R600 Localized edema: Secondary | ICD-10-CM | POA: Diagnosis not present

## 2015-12-19 DIAGNOSIS — I4891 Unspecified atrial fibrillation: Secondary | ICD-10-CM | POA: Diagnosis not present

## 2015-12-30 DIAGNOSIS — I509 Heart failure, unspecified: Secondary | ICD-10-CM | POA: Diagnosis not present

## 2015-12-30 DIAGNOSIS — Z7901 Long term (current) use of anticoagulants: Secondary | ICD-10-CM | POA: Diagnosis not present

## 2016-01-02 DIAGNOSIS — I480 Paroxysmal atrial fibrillation: Secondary | ICD-10-CM | POA: Diagnosis not present

## 2016-01-02 DIAGNOSIS — I5042 Chronic combined systolic (congestive) and diastolic (congestive) heart failure: Secondary | ICD-10-CM | POA: Diagnosis not present

## 2016-01-02 DIAGNOSIS — I420 Dilated cardiomyopathy: Secondary | ICD-10-CM | POA: Diagnosis not present

## 2016-01-02 DIAGNOSIS — R0609 Other forms of dyspnea: Secondary | ICD-10-CM | POA: Diagnosis not present

## 2016-01-21 DIAGNOSIS — I4891 Unspecified atrial fibrillation: Secondary | ICD-10-CM | POA: Diagnosis not present

## 2016-01-21 DIAGNOSIS — Z6827 Body mass index (BMI) 27.0-27.9, adult: Secondary | ICD-10-CM | POA: Diagnosis not present

## 2016-02-03 DIAGNOSIS — Z823 Family history of stroke: Secondary | ICD-10-CM | POA: Diagnosis not present

## 2016-02-03 DIAGNOSIS — Z9889 Other specified postprocedural states: Secondary | ICD-10-CM | POA: Diagnosis not present

## 2016-02-03 DIAGNOSIS — R079 Chest pain, unspecified: Secondary | ICD-10-CM | POA: Diagnosis not present

## 2016-02-03 DIAGNOSIS — I420 Dilated cardiomyopathy: Secondary | ICD-10-CM | POA: Diagnosis not present

## 2016-02-03 DIAGNOSIS — I110 Hypertensive heart disease with heart failure: Secondary | ICD-10-CM | POA: Diagnosis not present

## 2016-02-03 DIAGNOSIS — I493 Ventricular premature depolarization: Secondary | ICD-10-CM | POA: Diagnosis not present

## 2016-02-03 DIAGNOSIS — Z970 Presence of artificial eye: Secondary | ICD-10-CM | POA: Diagnosis not present

## 2016-02-03 DIAGNOSIS — I472 Ventricular tachycardia: Secondary | ICD-10-CM | POA: Diagnosis not present

## 2016-02-03 DIAGNOSIS — N189 Chronic kidney disease, unspecified: Secondary | ICD-10-CM | POA: Diagnosis not present

## 2016-02-03 DIAGNOSIS — I5043 Acute on chronic combined systolic (congestive) and diastolic (congestive) heart failure: Secondary | ICD-10-CM | POA: Diagnosis not present

## 2016-02-03 DIAGNOSIS — I480 Paroxysmal atrial fibrillation: Secondary | ICD-10-CM | POA: Diagnosis not present

## 2016-02-03 DIAGNOSIS — K219 Gastro-esophageal reflux disease without esophagitis: Secondary | ICD-10-CM | POA: Diagnosis present

## 2016-02-03 DIAGNOSIS — Z515 Encounter for palliative care: Secondary | ICD-10-CM | POA: Diagnosis not present

## 2016-02-03 DIAGNOSIS — K5900 Constipation, unspecified: Secondary | ICD-10-CM | POA: Diagnosis not present

## 2016-02-03 DIAGNOSIS — I454 Nonspecific intraventricular block: Secondary | ICD-10-CM | POA: Diagnosis not present

## 2016-02-03 DIAGNOSIS — R0789 Other chest pain: Secondary | ICD-10-CM | POA: Diagnosis not present

## 2016-02-03 DIAGNOSIS — R001 Bradycardia, unspecified: Secondary | ICD-10-CM | POA: Diagnosis not present

## 2016-02-03 DIAGNOSIS — N179 Acute kidney failure, unspecified: Secondary | ICD-10-CM | POA: Diagnosis not present

## 2016-02-03 DIAGNOSIS — I428 Other cardiomyopathies: Secondary | ICD-10-CM | POA: Diagnosis not present

## 2016-02-03 DIAGNOSIS — E785 Hyperlipidemia, unspecified: Secondary | ICD-10-CM | POA: Diagnosis present

## 2016-02-03 DIAGNOSIS — I2582 Chronic total occlusion of coronary artery: Secondary | ICD-10-CM | POA: Diagnosis not present

## 2016-02-03 DIAGNOSIS — Z66 Do not resuscitate: Secondary | ICD-10-CM | POA: Diagnosis not present

## 2016-02-03 DIAGNOSIS — I5021 Acute systolic (congestive) heart failure: Secondary | ICD-10-CM | POA: Diagnosis not present

## 2016-02-03 DIAGNOSIS — I429 Cardiomyopathy, unspecified: Secondary | ICD-10-CM | POA: Diagnosis not present

## 2016-02-03 DIAGNOSIS — I509 Heart failure, unspecified: Secondary | ICD-10-CM | POA: Diagnosis not present

## 2016-02-03 DIAGNOSIS — R7989 Other specified abnormal findings of blood chemistry: Secondary | ICD-10-CM | POA: Diagnosis not present

## 2016-02-03 DIAGNOSIS — I4891 Unspecified atrial fibrillation: Secondary | ICD-10-CM | POA: Diagnosis not present

## 2016-02-03 DIAGNOSIS — I5042 Chronic combined systolic (congestive) and diastolic (congestive) heart failure: Secondary | ICD-10-CM | POA: Diagnosis not present

## 2016-02-03 DIAGNOSIS — I959 Hypotension, unspecified: Secondary | ICD-10-CM | POA: Diagnosis not present

## 2016-02-03 DIAGNOSIS — J9601 Acute respiratory failure with hypoxia: Secondary | ICD-10-CM | POA: Diagnosis not present

## 2016-02-03 DIAGNOSIS — I444 Left anterior fascicular block: Secondary | ICD-10-CM | POA: Diagnosis not present

## 2016-02-03 DIAGNOSIS — I5023 Acute on chronic systolic (congestive) heart failure: Secondary | ICD-10-CM | POA: Diagnosis not present

## 2016-02-03 DIAGNOSIS — R0602 Shortness of breath: Secondary | ICD-10-CM | POA: Diagnosis not present

## 2016-02-03 DIAGNOSIS — I129 Hypertensive chronic kidney disease with stage 1 through stage 4 chronic kidney disease, or unspecified chronic kidney disease: Secondary | ICD-10-CM | POA: Diagnosis present

## 2016-02-03 DIAGNOSIS — J810 Acute pulmonary edema: Secondary | ICD-10-CM | POA: Diagnosis not present

## 2016-02-03 DIAGNOSIS — I517 Cardiomegaly: Secondary | ICD-10-CM | POA: Diagnosis not present

## 2016-02-03 DIAGNOSIS — I2510 Atherosclerotic heart disease of native coronary artery without angina pectoris: Secondary | ICD-10-CM | POA: Diagnosis not present

## 2016-02-03 DIAGNOSIS — Z8673 Personal history of transient ischemic attack (TIA), and cerebral infarction without residual deficits: Secondary | ICD-10-CM | POA: Diagnosis not present

## 2016-02-03 DIAGNOSIS — G629 Polyneuropathy, unspecified: Secondary | ICD-10-CM | POA: Diagnosis not present

## 2016-02-03 DIAGNOSIS — Z7901 Long term (current) use of anticoagulants: Secondary | ICD-10-CM | POA: Diagnosis not present

## 2016-02-20 DIAGNOSIS — I420 Dilated cardiomyopathy: Secondary | ICD-10-CM | POA: Diagnosis not present

## 2016-02-20 DIAGNOSIS — I5043 Acute on chronic combined systolic (congestive) and diastolic (congestive) heart failure: Secondary | ICD-10-CM | POA: Diagnosis not present

## 2016-02-20 DIAGNOSIS — Z66 Do not resuscitate: Secondary | ICD-10-CM | POA: Diagnosis not present

## 2016-02-22 DIAGNOSIS — I2510 Atherosclerotic heart disease of native coronary artery without angina pectoris: Secondary | ICD-10-CM | POA: Diagnosis not present

## 2016-02-22 DIAGNOSIS — I5043 Acute on chronic combined systolic (congestive) and diastolic (congestive) heart failure: Secondary | ICD-10-CM | POA: Diagnosis not present

## 2016-02-22 DIAGNOSIS — I110 Hypertensive heart disease with heart failure: Secondary | ICD-10-CM | POA: Diagnosis not present

## 2016-02-22 DIAGNOSIS — I4891 Unspecified atrial fibrillation: Secondary | ICD-10-CM | POA: Diagnosis not present

## 2016-02-22 DIAGNOSIS — I429 Cardiomyopathy, unspecified: Secondary | ICD-10-CM | POA: Diagnosis not present

## 2016-02-25 DIAGNOSIS — K219 Gastro-esophageal reflux disease without esophagitis: Secondary | ICD-10-CM | POA: Diagnosis present

## 2016-02-25 DIAGNOSIS — I5023 Acute on chronic systolic (congestive) heart failure: Secondary | ICD-10-CM | POA: Diagnosis present

## 2016-02-25 DIAGNOSIS — R001 Bradycardia, unspecified: Secondary | ICD-10-CM | POA: Diagnosis present

## 2016-02-25 DIAGNOSIS — I110 Hypertensive heart disease with heart failure: Secondary | ICD-10-CM | POA: Diagnosis not present

## 2016-02-25 DIAGNOSIS — I2510 Atherosclerotic heart disease of native coronary artery without angina pectoris: Secondary | ICD-10-CM | POA: Diagnosis present

## 2016-02-25 DIAGNOSIS — I429 Cardiomyopathy, unspecified: Secondary | ICD-10-CM | POA: Diagnosis not present

## 2016-02-25 DIAGNOSIS — J9621 Acute and chronic respiratory failure with hypoxia: Secondary | ICD-10-CM | POA: Diagnosis present

## 2016-02-25 DIAGNOSIS — I130 Hypertensive heart and chronic kidney disease with heart failure and stage 1 through stage 4 chronic kidney disease, or unspecified chronic kidney disease: Secondary | ICD-10-CM | POA: Diagnosis present

## 2016-02-25 DIAGNOSIS — I5043 Acute on chronic combined systolic (congestive) and diastolic (congestive) heart failure: Secondary | ICD-10-CM | POA: Diagnosis not present

## 2016-02-25 DIAGNOSIS — Z79899 Other long term (current) drug therapy: Secondary | ICD-10-CM | POA: Diagnosis not present

## 2016-02-25 DIAGNOSIS — N401 Enlarged prostate with lower urinary tract symptoms: Secondary | ICD-10-CM | POA: Diagnosis present

## 2016-02-25 DIAGNOSIS — N138 Other obstructive and reflux uropathy: Secondary | ICD-10-CM | POA: Diagnosis present

## 2016-02-25 DIAGNOSIS — I272 Other secondary pulmonary hypertension: Secondary | ICD-10-CM | POA: Diagnosis present

## 2016-02-25 DIAGNOSIS — N289 Disorder of kidney and ureter, unspecified: Secondary | ICD-10-CM | POA: Diagnosis present

## 2016-02-25 DIAGNOSIS — Z8673 Personal history of transient ischemic attack (TIA), and cerebral infarction without residual deficits: Secondary | ICD-10-CM | POA: Diagnosis not present

## 2016-02-25 DIAGNOSIS — H9190 Unspecified hearing loss, unspecified ear: Secondary | ICD-10-CM | POA: Diagnosis present

## 2016-02-25 DIAGNOSIS — I959 Hypotension, unspecified: Secondary | ICD-10-CM | POA: Diagnosis not present

## 2016-02-25 DIAGNOSIS — I4891 Unspecified atrial fibrillation: Secondary | ICD-10-CM | POA: Diagnosis present

## 2016-02-25 DIAGNOSIS — I420 Dilated cardiomyopathy: Secondary | ICD-10-CM | POA: Diagnosis present

## 2016-02-25 DIAGNOSIS — R0602 Shortness of breath: Secondary | ICD-10-CM | POA: Diagnosis not present

## 2016-02-25 DIAGNOSIS — N183 Chronic kidney disease, stage 3 (moderate): Secondary | ICD-10-CM | POA: Diagnosis present

## 2016-02-25 DIAGNOSIS — Z515 Encounter for palliative care: Secondary | ICD-10-CM | POA: Diagnosis present

## 2016-02-25 DIAGNOSIS — N390 Urinary tract infection, site not specified: Secondary | ICD-10-CM | POA: Diagnosis present

## 2016-02-25 DIAGNOSIS — N3289 Other specified disorders of bladder: Secondary | ICD-10-CM | POA: Diagnosis present

## 2016-02-25 DIAGNOSIS — I509 Heart failure, unspecified: Secondary | ICD-10-CM | POA: Diagnosis not present

## 2016-02-26 DIAGNOSIS — N179 Acute kidney failure, unspecified: Secondary | ICD-10-CM | POA: Diagnosis present

## 2016-02-26 DIAGNOSIS — Z008 Encounter for other general examination: Secondary | ICD-10-CM | POA: Diagnosis not present

## 2016-02-26 DIAGNOSIS — R791 Abnormal coagulation profile: Secondary | ICD-10-CM | POA: Diagnosis present

## 2016-02-26 DIAGNOSIS — Z79899 Other long term (current) drug therapy: Secondary | ICD-10-CM | POA: Diagnosis not present

## 2016-02-26 DIAGNOSIS — D696 Thrombocytopenia, unspecified: Secondary | ICD-10-CM | POA: Diagnosis not present

## 2016-02-26 DIAGNOSIS — R5381 Other malaise: Secondary | ICD-10-CM | POA: Diagnosis present

## 2016-02-26 DIAGNOSIS — J90 Pleural effusion, not elsewhere classified: Secondary | ICD-10-CM | POA: Diagnosis not present

## 2016-02-26 DIAGNOSIS — R931 Abnormal findings on diagnostic imaging of heart and coronary circulation: Secondary | ICD-10-CM | POA: Diagnosis not present

## 2016-02-26 DIAGNOSIS — I4901 Ventricular fibrillation: Secondary | ICD-10-CM | POA: Diagnosis present

## 2016-02-26 DIAGNOSIS — I428 Other cardiomyopathies: Secondary | ICD-10-CM | POA: Diagnosis not present

## 2016-02-26 DIAGNOSIS — J9601 Acute respiratory failure with hypoxia: Secondary | ICD-10-CM | POA: Diagnosis not present

## 2016-02-26 DIAGNOSIS — G8918 Other acute postprocedural pain: Secondary | ICD-10-CM | POA: Diagnosis not present

## 2016-02-26 DIAGNOSIS — I429 Cardiomyopathy, unspecified: Secondary | ICD-10-CM | POA: Diagnosis not present

## 2016-02-26 DIAGNOSIS — I361 Nonrheumatic tricuspid (valve) insufficiency: Secondary | ICD-10-CM | POA: Diagnosis not present

## 2016-02-26 DIAGNOSIS — H5441 Blindness, right eye, normal vision left eye: Secondary | ICD-10-CM | POA: Diagnosis present

## 2016-02-26 DIAGNOSIS — I480 Paroxysmal atrial fibrillation: Secondary | ICD-10-CM | POA: Diagnosis present

## 2016-02-26 DIAGNOSIS — Z9119 Patient's noncompliance with other medical treatment and regimen: Secondary | ICD-10-CM | POA: Diagnosis not present

## 2016-02-26 DIAGNOSIS — Z7901 Long term (current) use of anticoagulants: Secondary | ICD-10-CM | POA: Diagnosis not present

## 2016-02-26 DIAGNOSIS — Z4509 Encounter for adjustment and management of other cardiac device: Secondary | ICD-10-CM | POA: Diagnosis not present

## 2016-02-26 DIAGNOSIS — I5022 Chronic systolic (congestive) heart failure: Secondary | ICD-10-CM | POA: Diagnosis not present

## 2016-02-26 DIAGNOSIS — E785 Hyperlipidemia, unspecified: Secondary | ICD-10-CM | POA: Diagnosis present

## 2016-02-26 DIAGNOSIS — R262 Difficulty in walking, not elsewhere classified: Secondary | ICD-10-CM | POA: Diagnosis not present

## 2016-02-26 DIAGNOSIS — Z006 Encounter for examination for normal comparison and control in clinical research program: Secondary | ICD-10-CM | POA: Diagnosis not present

## 2016-02-26 DIAGNOSIS — I517 Cardiomegaly: Secondary | ICD-10-CM | POA: Diagnosis not present

## 2016-02-26 DIAGNOSIS — I10 Essential (primary) hypertension: Secondary | ICD-10-CM | POA: Diagnosis not present

## 2016-02-26 DIAGNOSIS — R0689 Other abnormalities of breathing: Secondary | ICD-10-CM | POA: Diagnosis not present

## 2016-02-26 DIAGNOSIS — D62 Acute posthemorrhagic anemia: Secondary | ICD-10-CM | POA: Diagnosis not present

## 2016-02-26 DIAGNOSIS — Z4682 Encounter for fitting and adjustment of non-vascular catheter: Secondary | ICD-10-CM | POA: Diagnosis not present

## 2016-02-26 DIAGNOSIS — I340 Nonrheumatic mitral (valve) insufficiency: Secondary | ICD-10-CM | POA: Diagnosis not present

## 2016-02-26 DIAGNOSIS — I509 Heart failure, unspecified: Secondary | ICD-10-CM | POA: Diagnosis not present

## 2016-02-26 DIAGNOSIS — J9811 Atelectasis: Secondary | ICD-10-CM | POA: Diagnosis not present

## 2016-02-26 DIAGNOSIS — R570 Cardiogenic shock: Secondary | ICD-10-CM | POA: Diagnosis not present

## 2016-02-26 DIAGNOSIS — I130 Hypertensive heart and chronic kidney disease with heart failure and stage 1 through stage 4 chronic kidney disease, or unspecified chronic kidney disease: Secondary | ICD-10-CM | POA: Diagnosis present

## 2016-02-26 DIAGNOSIS — I420 Dilated cardiomyopathy: Secondary | ICD-10-CM | POA: Diagnosis not present

## 2016-02-26 DIAGNOSIS — Z7401 Bed confinement status: Secondary | ICD-10-CM | POA: Diagnosis not present

## 2016-02-26 DIAGNOSIS — Z48812 Encounter for surgical aftercare following surgery on the circulatory system: Secondary | ICD-10-CM | POA: Diagnosis not present

## 2016-02-26 DIAGNOSIS — F4321 Adjustment disorder with depressed mood: Secondary | ICD-10-CM | POA: Diagnosis not present

## 2016-02-26 DIAGNOSIS — R918 Other nonspecific abnormal finding of lung field: Secondary | ICD-10-CM | POA: Diagnosis not present

## 2016-02-26 DIAGNOSIS — Z9889 Other specified postprocedural states: Secondary | ICD-10-CM | POA: Diagnosis not present

## 2016-02-26 DIAGNOSIS — J939 Pneumothorax, unspecified: Secondary | ICD-10-CM | POA: Diagnosis not present

## 2016-02-26 DIAGNOSIS — I501 Left ventricular failure: Secondary | ICD-10-CM | POA: Diagnosis not present

## 2016-02-26 DIAGNOSIS — Y33XXXA Other specified events, undetermined intent, initial encounter: Secondary | ICD-10-CM | POA: Diagnosis not present

## 2016-02-26 DIAGNOSIS — J811 Chronic pulmonary edema: Secondary | ICD-10-CM | POA: Diagnosis not present

## 2016-02-26 DIAGNOSIS — Z95811 Presence of heart assist device: Secondary | ICD-10-CM | POA: Diagnosis not present

## 2016-02-26 DIAGNOSIS — I5043 Acute on chronic combined systolic (congestive) and diastolic (congestive) heart failure: Secondary | ICD-10-CM | POA: Diagnosis present

## 2016-02-26 DIAGNOSIS — I472 Ventricular tachycardia: Secondary | ICD-10-CM | POA: Diagnosis not present

## 2016-02-26 DIAGNOSIS — T8111XA Postprocedural  cardiogenic shock, initial encounter: Secondary | ICD-10-CM | POA: Diagnosis not present

## 2016-02-26 DIAGNOSIS — N182 Chronic kidney disease, stage 2 (mild): Secondary | ICD-10-CM | POA: Diagnosis present

## 2016-02-26 DIAGNOSIS — T82867A Thrombosis of cardiac prosthetic devices, implants and grafts, initial encounter: Secondary | ICD-10-CM | POA: Diagnosis not present

## 2016-02-26 DIAGNOSIS — Z452 Encounter for adjustment and management of vascular access device: Secondary | ICD-10-CM | POA: Diagnosis not present

## 2016-02-26 DIAGNOSIS — J9611 Chronic respiratory failure with hypoxia: Secondary | ICD-10-CM | POA: Diagnosis not present

## 2016-02-26 DIAGNOSIS — I5023 Acute on chronic systolic (congestive) heart failure: Secondary | ICD-10-CM | POA: Diagnosis not present

## 2016-02-26 DIAGNOSIS — J948 Other specified pleural conditions: Secondary | ICD-10-CM | POA: Diagnosis not present

## 2016-02-26 DIAGNOSIS — J982 Interstitial emphysema: Secondary | ICD-10-CM | POA: Diagnosis not present

## 2016-02-26 DIAGNOSIS — Z9911 Dependence on respirator [ventilator] status: Secondary | ICD-10-CM | POA: Diagnosis not present

## 2016-03-18 DIAGNOSIS — I428 Other cardiomyopathies: Secondary | ICD-10-CM | POA: Diagnosis not present

## 2016-03-18 DIAGNOSIS — E785 Hyperlipidemia, unspecified: Secondary | ICD-10-CM | POA: Diagnosis not present

## 2016-03-18 DIAGNOSIS — Z95811 Presence of heart assist device: Secondary | ICD-10-CM | POA: Diagnosis not present

## 2016-03-18 DIAGNOSIS — Z9181 History of falling: Secondary | ICD-10-CM | POA: Diagnosis not present

## 2016-03-18 DIAGNOSIS — N189 Chronic kidney disease, unspecified: Secondary | ICD-10-CM | POA: Diagnosis not present

## 2016-03-18 DIAGNOSIS — Z7901 Long term (current) use of anticoagulants: Secondary | ICD-10-CM | POA: Diagnosis not present

## 2016-03-18 DIAGNOSIS — Z48812 Encounter for surgical aftercare following surgery on the circulatory system: Secondary | ICD-10-CM | POA: Diagnosis not present

## 2016-03-18 DIAGNOSIS — I4891 Unspecified atrial fibrillation: Secondary | ICD-10-CM | POA: Diagnosis not present

## 2016-03-18 DIAGNOSIS — I5042 Chronic combined systolic (congestive) and diastolic (congestive) heart failure: Secondary | ICD-10-CM | POA: Diagnosis not present

## 2016-03-19 DIAGNOSIS — Z48812 Encounter for surgical aftercare following surgery on the circulatory system: Secondary | ICD-10-CM | POA: Diagnosis not present

## 2016-03-19 DIAGNOSIS — I428 Other cardiomyopathies: Secondary | ICD-10-CM | POA: Diagnosis not present

## 2016-03-19 DIAGNOSIS — I4891 Unspecified atrial fibrillation: Secondary | ICD-10-CM | POA: Diagnosis not present

## 2016-03-19 DIAGNOSIS — I5042 Chronic combined systolic (congestive) and diastolic (congestive) heart failure: Secondary | ICD-10-CM | POA: Diagnosis not present

## 2016-03-19 DIAGNOSIS — N189 Chronic kidney disease, unspecified: Secondary | ICD-10-CM | POA: Diagnosis not present

## 2016-03-19 DIAGNOSIS — E785 Hyperlipidemia, unspecified: Secondary | ICD-10-CM | POA: Diagnosis not present

## 2016-03-20 DIAGNOSIS — E785 Hyperlipidemia, unspecified: Secondary | ICD-10-CM | POA: Diagnosis not present

## 2016-03-20 DIAGNOSIS — I5042 Chronic combined systolic (congestive) and diastolic (congestive) heart failure: Secondary | ICD-10-CM | POA: Diagnosis not present

## 2016-03-20 DIAGNOSIS — N189 Chronic kidney disease, unspecified: Secondary | ICD-10-CM | POA: Diagnosis not present

## 2016-03-20 DIAGNOSIS — Z7901 Long term (current) use of anticoagulants: Secondary | ICD-10-CM | POA: Diagnosis not present

## 2016-03-20 DIAGNOSIS — Z48812 Encounter for surgical aftercare following surgery on the circulatory system: Secondary | ICD-10-CM | POA: Diagnosis not present

## 2016-03-20 DIAGNOSIS — R791 Abnormal coagulation profile: Secondary | ICD-10-CM | POA: Diagnosis not present

## 2016-03-20 DIAGNOSIS — I4891 Unspecified atrial fibrillation: Secondary | ICD-10-CM | POA: Diagnosis not present

## 2016-03-20 DIAGNOSIS — Z5181 Encounter for therapeutic drug level monitoring: Secondary | ICD-10-CM | POA: Diagnosis not present

## 2016-03-20 DIAGNOSIS — I428 Other cardiomyopathies: Secondary | ICD-10-CM | POA: Diagnosis not present

## 2016-03-23 DIAGNOSIS — N189 Chronic kidney disease, unspecified: Secondary | ICD-10-CM | POA: Diagnosis not present

## 2016-03-23 DIAGNOSIS — I4891 Unspecified atrial fibrillation: Secondary | ICD-10-CM | POA: Diagnosis not present

## 2016-03-23 DIAGNOSIS — Z48812 Encounter for surgical aftercare following surgery on the circulatory system: Secondary | ICD-10-CM | POA: Diagnosis not present

## 2016-03-23 DIAGNOSIS — I428 Other cardiomyopathies: Secondary | ICD-10-CM | POA: Diagnosis not present

## 2016-03-23 DIAGNOSIS — I5042 Chronic combined systolic (congestive) and diastolic (congestive) heart failure: Secondary | ICD-10-CM | POA: Diagnosis not present

## 2016-03-23 DIAGNOSIS — E785 Hyperlipidemia, unspecified: Secondary | ICD-10-CM | POA: Diagnosis not present

## 2016-03-24 DIAGNOSIS — I428 Other cardiomyopathies: Secondary | ICD-10-CM | POA: Diagnosis not present

## 2016-03-24 DIAGNOSIS — Z48812 Encounter for surgical aftercare following surgery on the circulatory system: Secondary | ICD-10-CM | POA: Diagnosis not present

## 2016-03-24 DIAGNOSIS — I4891 Unspecified atrial fibrillation: Secondary | ICD-10-CM | POA: Diagnosis not present

## 2016-03-24 DIAGNOSIS — N189 Chronic kidney disease, unspecified: Secondary | ICD-10-CM | POA: Diagnosis not present

## 2016-03-24 DIAGNOSIS — E785 Hyperlipidemia, unspecified: Secondary | ICD-10-CM | POA: Diagnosis not present

## 2016-03-24 DIAGNOSIS — I5042 Chronic combined systolic (congestive) and diastolic (congestive) heart failure: Secondary | ICD-10-CM | POA: Diagnosis not present

## 2016-03-25 DIAGNOSIS — I10 Essential (primary) hypertension: Secondary | ICD-10-CM | POA: Diagnosis not present

## 2016-03-25 DIAGNOSIS — Z4509 Encounter for adjustment and management of other cardiac device: Secondary | ICD-10-CM | POA: Diagnosis not present

## 2016-03-25 DIAGNOSIS — I429 Cardiomyopathy, unspecified: Secondary | ICD-10-CM | POA: Diagnosis not present

## 2016-03-25 DIAGNOSIS — Z7982 Long term (current) use of aspirin: Secondary | ICD-10-CM | POA: Diagnosis not present

## 2016-03-25 DIAGNOSIS — I480 Paroxysmal atrial fibrillation: Secondary | ICD-10-CM | POA: Diagnosis not present

## 2016-03-25 DIAGNOSIS — Z79899 Other long term (current) drug therapy: Secondary | ICD-10-CM | POA: Diagnosis not present

## 2016-03-25 DIAGNOSIS — Z7901 Long term (current) use of anticoagulants: Secondary | ICD-10-CM | POA: Diagnosis not present

## 2016-03-25 DIAGNOSIS — I428 Other cardiomyopathies: Secondary | ICD-10-CM | POA: Diagnosis not present

## 2016-03-25 DIAGNOSIS — Z95811 Presence of heart assist device: Secondary | ICD-10-CM | POA: Diagnosis not present

## 2016-03-25 DIAGNOSIS — R9431 Abnormal electrocardiogram [ECG] [EKG]: Secondary | ICD-10-CM | POA: Diagnosis not present

## 2016-03-26 DIAGNOSIS — E785 Hyperlipidemia, unspecified: Secondary | ICD-10-CM | POA: Diagnosis not present

## 2016-03-26 DIAGNOSIS — N189 Chronic kidney disease, unspecified: Secondary | ICD-10-CM | POA: Diagnosis not present

## 2016-03-26 DIAGNOSIS — I5042 Chronic combined systolic (congestive) and diastolic (congestive) heart failure: Secondary | ICD-10-CM | POA: Diagnosis not present

## 2016-03-26 DIAGNOSIS — Z48812 Encounter for surgical aftercare following surgery on the circulatory system: Secondary | ICD-10-CM | POA: Diagnosis not present

## 2016-03-26 DIAGNOSIS — I4891 Unspecified atrial fibrillation: Secondary | ICD-10-CM | POA: Diagnosis not present

## 2016-03-26 DIAGNOSIS — I428 Other cardiomyopathies: Secondary | ICD-10-CM | POA: Diagnosis not present

## 2016-03-27 DIAGNOSIS — Z48812 Encounter for surgical aftercare following surgery on the circulatory system: Secondary | ICD-10-CM | POA: Diagnosis not present

## 2016-03-27 DIAGNOSIS — E785 Hyperlipidemia, unspecified: Secondary | ICD-10-CM | POA: Diagnosis not present

## 2016-03-27 DIAGNOSIS — N189 Chronic kidney disease, unspecified: Secondary | ICD-10-CM | POA: Diagnosis not present

## 2016-03-27 DIAGNOSIS — I4891 Unspecified atrial fibrillation: Secondary | ICD-10-CM | POA: Diagnosis not present

## 2016-03-27 DIAGNOSIS — I5042 Chronic combined systolic (congestive) and diastolic (congestive) heart failure: Secondary | ICD-10-CM | POA: Diagnosis not present

## 2016-03-27 DIAGNOSIS — I428 Other cardiomyopathies: Secondary | ICD-10-CM | POA: Diagnosis not present

## 2016-03-30 DIAGNOSIS — N189 Chronic kidney disease, unspecified: Secondary | ICD-10-CM | POA: Diagnosis not present

## 2016-03-30 DIAGNOSIS — I428 Other cardiomyopathies: Secondary | ICD-10-CM | POA: Diagnosis not present

## 2016-03-30 DIAGNOSIS — I5042 Chronic combined systolic (congestive) and diastolic (congestive) heart failure: Secondary | ICD-10-CM | POA: Diagnosis not present

## 2016-03-30 DIAGNOSIS — E785 Hyperlipidemia, unspecified: Secondary | ICD-10-CM | POA: Diagnosis not present

## 2016-03-30 DIAGNOSIS — I4891 Unspecified atrial fibrillation: Secondary | ICD-10-CM | POA: Diagnosis not present

## 2016-03-30 DIAGNOSIS — Z48812 Encounter for surgical aftercare following surgery on the circulatory system: Secondary | ICD-10-CM | POA: Diagnosis not present

## 2016-03-31 DIAGNOSIS — Z48812 Encounter for surgical aftercare following surgery on the circulatory system: Secondary | ICD-10-CM | POA: Diagnosis not present

## 2016-03-31 DIAGNOSIS — I428 Other cardiomyopathies: Secondary | ICD-10-CM | POA: Diagnosis not present

## 2016-03-31 DIAGNOSIS — I4891 Unspecified atrial fibrillation: Secondary | ICD-10-CM | POA: Diagnosis not present

## 2016-03-31 DIAGNOSIS — I5042 Chronic combined systolic (congestive) and diastolic (congestive) heart failure: Secondary | ICD-10-CM | POA: Diagnosis not present

## 2016-03-31 DIAGNOSIS — E785 Hyperlipidemia, unspecified: Secondary | ICD-10-CM | POA: Diagnosis not present

## 2016-03-31 DIAGNOSIS — N189 Chronic kidney disease, unspecified: Secondary | ICD-10-CM | POA: Diagnosis not present

## 2016-04-01 DIAGNOSIS — J90 Pleural effusion, not elsewhere classified: Secondary | ICD-10-CM | POA: Diagnosis not present

## 2016-04-01 DIAGNOSIS — I480 Paroxysmal atrial fibrillation: Secondary | ICD-10-CM | POA: Diagnosis not present

## 2016-04-01 DIAGNOSIS — I472 Ventricular tachycardia: Secondary | ICD-10-CM | POA: Diagnosis not present

## 2016-04-01 DIAGNOSIS — E8809 Other disorders of plasma-protein metabolism, not elsewhere classified: Secondary | ICD-10-CM | POA: Diagnosis present

## 2016-04-01 DIAGNOSIS — Z95811 Presence of heart assist device: Secondary | ICD-10-CM | POA: Diagnosis not present

## 2016-04-01 DIAGNOSIS — Z006 Encounter for examination for normal comparison and control in clinical research program: Secondary | ICD-10-CM | POA: Diagnosis not present

## 2016-04-01 DIAGNOSIS — I5022 Chronic systolic (congestive) heart failure: Secondary | ICD-10-CM | POA: Diagnosis not present

## 2016-04-01 DIAGNOSIS — R269 Unspecified abnormalities of gait and mobility: Secondary | ICD-10-CM | POA: Diagnosis not present

## 2016-04-01 DIAGNOSIS — I428 Other cardiomyopathies: Secondary | ICD-10-CM | POA: Diagnosis not present

## 2016-04-01 DIAGNOSIS — I517 Cardiomegaly: Secondary | ICD-10-CM | POA: Diagnosis not present

## 2016-04-01 DIAGNOSIS — R918 Other nonspecific abnormal finding of lung field: Secondary | ICD-10-CM | POA: Diagnosis not present

## 2016-04-01 DIAGNOSIS — R2689 Other abnormalities of gait and mobility: Secondary | ICD-10-CM | POA: Diagnosis not present

## 2016-04-01 DIAGNOSIS — I130 Hypertensive heart and chronic kidney disease with heart failure and stage 1 through stage 4 chronic kidney disease, or unspecified chronic kidney disease: Secondary | ICD-10-CM | POA: Diagnosis present

## 2016-04-01 DIAGNOSIS — N182 Chronic kidney disease, stage 2 (mild): Secondary | ICD-10-CM | POA: Diagnosis present

## 2016-04-01 DIAGNOSIS — E785 Hyperlipidemia, unspecified: Secondary | ICD-10-CM | POA: Diagnosis present

## 2016-04-01 DIAGNOSIS — J9811 Atelectasis: Secondary | ICD-10-CM | POA: Diagnosis not present

## 2016-04-01 DIAGNOSIS — D509 Iron deficiency anemia, unspecified: Secondary | ICD-10-CM | POA: Diagnosis present

## 2016-04-01 DIAGNOSIS — Z7901 Long term (current) use of anticoagulants: Secondary | ICD-10-CM | POA: Diagnosis not present

## 2016-04-01 DIAGNOSIS — I5023 Acute on chronic systolic (congestive) heart failure: Secondary | ICD-10-CM | POA: Diagnosis not present

## 2016-04-09 DIAGNOSIS — I5042 Chronic combined systolic (congestive) and diastolic (congestive) heart failure: Secondary | ICD-10-CM | POA: Diagnosis not present

## 2016-04-09 DIAGNOSIS — Z48812 Encounter for surgical aftercare following surgery on the circulatory system: Secondary | ICD-10-CM | POA: Diagnosis not present

## 2016-04-09 DIAGNOSIS — N189 Chronic kidney disease, unspecified: Secondary | ICD-10-CM | POA: Diagnosis not present

## 2016-04-09 DIAGNOSIS — I428 Other cardiomyopathies: Secondary | ICD-10-CM | POA: Diagnosis not present

## 2016-04-09 DIAGNOSIS — Z95818 Presence of other cardiac implants and grafts: Secondary | ICD-10-CM | POA: Diagnosis not present

## 2016-04-09 DIAGNOSIS — E785 Hyperlipidemia, unspecified: Secondary | ICD-10-CM | POA: Diagnosis not present

## 2016-04-09 DIAGNOSIS — Z7901 Long term (current) use of anticoagulants: Secondary | ICD-10-CM | POA: Diagnosis not present

## 2016-04-09 DIAGNOSIS — I4891 Unspecified atrial fibrillation: Secondary | ICD-10-CM | POA: Diagnosis not present

## 2016-04-17 DIAGNOSIS — Z48812 Encounter for surgical aftercare following surgery on the circulatory system: Secondary | ICD-10-CM | POA: Diagnosis not present

## 2016-04-17 DIAGNOSIS — I5042 Chronic combined systolic (congestive) and diastolic (congestive) heart failure: Secondary | ICD-10-CM | POA: Diagnosis not present

## 2016-04-17 DIAGNOSIS — I4891 Unspecified atrial fibrillation: Secondary | ICD-10-CM | POA: Diagnosis not present

## 2016-04-17 DIAGNOSIS — I428 Other cardiomyopathies: Secondary | ICD-10-CM | POA: Diagnosis not present

## 2016-04-17 DIAGNOSIS — I130 Hypertensive heart and chronic kidney disease with heart failure and stage 1 through stage 4 chronic kidney disease, or unspecified chronic kidney disease: Secondary | ICD-10-CM | POA: Diagnosis not present

## 2016-04-17 DIAGNOSIS — N189 Chronic kidney disease, unspecified: Secondary | ICD-10-CM | POA: Diagnosis not present

## 2016-04-17 DIAGNOSIS — E785 Hyperlipidemia, unspecified: Secondary | ICD-10-CM | POA: Diagnosis not present

## 2016-04-20 DIAGNOSIS — I130 Hypertensive heart and chronic kidney disease with heart failure and stage 1 through stage 4 chronic kidney disease, or unspecified chronic kidney disease: Secondary | ICD-10-CM | POA: Diagnosis not present

## 2016-04-20 DIAGNOSIS — E785 Hyperlipidemia, unspecified: Secondary | ICD-10-CM | POA: Diagnosis not present

## 2016-04-20 DIAGNOSIS — Z48812 Encounter for surgical aftercare following surgery on the circulatory system: Secondary | ICD-10-CM | POA: Diagnosis not present

## 2016-04-20 DIAGNOSIS — I4891 Unspecified atrial fibrillation: Secondary | ICD-10-CM | POA: Diagnosis not present

## 2016-04-20 DIAGNOSIS — N189 Chronic kidney disease, unspecified: Secondary | ICD-10-CM | POA: Diagnosis not present

## 2016-04-20 DIAGNOSIS — I5042 Chronic combined systolic (congestive) and diastolic (congestive) heart failure: Secondary | ICD-10-CM | POA: Diagnosis not present

## 2016-04-20 DIAGNOSIS — I428 Other cardiomyopathies: Secondary | ICD-10-CM | POA: Diagnosis not present

## 2016-04-22 DIAGNOSIS — I428 Other cardiomyopathies: Secondary | ICD-10-CM | POA: Diagnosis not present

## 2016-04-22 DIAGNOSIS — I4891 Unspecified atrial fibrillation: Secondary | ICD-10-CM | POA: Diagnosis not present

## 2016-04-22 DIAGNOSIS — Z95811 Presence of heart assist device: Secondary | ICD-10-CM | POA: Diagnosis not present

## 2016-04-22 DIAGNOSIS — E785 Hyperlipidemia, unspecified: Secondary | ICD-10-CM | POA: Diagnosis not present

## 2016-04-22 DIAGNOSIS — Z7901 Long term (current) use of anticoagulants: Secondary | ICD-10-CM | POA: Diagnosis not present

## 2016-04-22 DIAGNOSIS — N189 Chronic kidney disease, unspecified: Secondary | ICD-10-CM | POA: Diagnosis not present

## 2016-04-22 DIAGNOSIS — I130 Hypertensive heart and chronic kidney disease with heart failure and stage 1 through stage 4 chronic kidney disease, or unspecified chronic kidney disease: Secondary | ICD-10-CM | POA: Diagnosis not present

## 2016-04-22 DIAGNOSIS — I5042 Chronic combined systolic (congestive) and diastolic (congestive) heart failure: Secondary | ICD-10-CM | POA: Diagnosis not present

## 2016-04-22 DIAGNOSIS — Z48812 Encounter for surgical aftercare following surgery on the circulatory system: Secondary | ICD-10-CM | POA: Diagnosis not present

## 2016-04-22 DIAGNOSIS — I471 Supraventricular tachycardia: Secondary | ICD-10-CM | POA: Diagnosis not present

## 2016-04-22 DIAGNOSIS — Z4801 Encounter for change or removal of surgical wound dressing: Secondary | ICD-10-CM | POA: Diagnosis not present

## 2016-04-28 DIAGNOSIS — I428 Other cardiomyopathies: Secondary | ICD-10-CM | POA: Diagnosis not present

## 2016-04-28 DIAGNOSIS — I4891 Unspecified atrial fibrillation: Secondary | ICD-10-CM | POA: Diagnosis not present

## 2016-04-28 DIAGNOSIS — I5042 Chronic combined systolic (congestive) and diastolic (congestive) heart failure: Secondary | ICD-10-CM | POA: Diagnosis not present

## 2016-04-28 DIAGNOSIS — N189 Chronic kidney disease, unspecified: Secondary | ICD-10-CM | POA: Diagnosis not present

## 2016-04-28 DIAGNOSIS — E785 Hyperlipidemia, unspecified: Secondary | ICD-10-CM | POA: Diagnosis not present

## 2016-04-28 DIAGNOSIS — Z48812 Encounter for surgical aftercare following surgery on the circulatory system: Secondary | ICD-10-CM | POA: Diagnosis not present

## 2016-04-29 DIAGNOSIS — N189 Chronic kidney disease, unspecified: Secondary | ICD-10-CM | POA: Diagnosis not present

## 2016-04-29 DIAGNOSIS — I4891 Unspecified atrial fibrillation: Secondary | ICD-10-CM | POA: Diagnosis not present

## 2016-04-29 DIAGNOSIS — Z48812 Encounter for surgical aftercare following surgery on the circulatory system: Secondary | ICD-10-CM | POA: Diagnosis not present

## 2016-04-29 DIAGNOSIS — Z7901 Long term (current) use of anticoagulants: Secondary | ICD-10-CM | POA: Diagnosis not present

## 2016-04-29 DIAGNOSIS — I5042 Chronic combined systolic (congestive) and diastolic (congestive) heart failure: Secondary | ICD-10-CM | POA: Diagnosis not present

## 2016-04-29 DIAGNOSIS — Z95811 Presence of heart assist device: Secondary | ICD-10-CM | POA: Diagnosis not present

## 2016-04-29 DIAGNOSIS — I428 Other cardiomyopathies: Secondary | ICD-10-CM | POA: Diagnosis not present

## 2016-04-29 DIAGNOSIS — I130 Hypertensive heart and chronic kidney disease with heart failure and stage 1 through stage 4 chronic kidney disease, or unspecified chronic kidney disease: Secondary | ICD-10-CM | POA: Diagnosis not present

## 2016-04-29 DIAGNOSIS — E785 Hyperlipidemia, unspecified: Secondary | ICD-10-CM | POA: Diagnosis not present

## 2016-05-06 DIAGNOSIS — Z48812 Encounter for surgical aftercare following surgery on the circulatory system: Secondary | ICD-10-CM | POA: Diagnosis not present

## 2016-05-06 DIAGNOSIS — I130 Hypertensive heart and chronic kidney disease with heart failure and stage 1 through stage 4 chronic kidney disease, or unspecified chronic kidney disease: Secondary | ICD-10-CM | POA: Diagnosis not present

## 2016-05-06 DIAGNOSIS — I428 Other cardiomyopathies: Secondary | ICD-10-CM | POA: Diagnosis not present

## 2016-05-06 DIAGNOSIS — I5042 Chronic combined systolic (congestive) and diastolic (congestive) heart failure: Secondary | ICD-10-CM | POA: Diagnosis not present

## 2016-05-06 DIAGNOSIS — N189 Chronic kidney disease, unspecified: Secondary | ICD-10-CM | POA: Diagnosis not present

## 2016-05-06 DIAGNOSIS — E785 Hyperlipidemia, unspecified: Secondary | ICD-10-CM | POA: Diagnosis not present

## 2016-05-06 DIAGNOSIS — I4891 Unspecified atrial fibrillation: Secondary | ICD-10-CM | POA: Diagnosis not present

## 2016-05-13 DIAGNOSIS — Z48812 Encounter for surgical aftercare following surgery on the circulatory system: Secondary | ICD-10-CM | POA: Diagnosis not present

## 2016-05-13 DIAGNOSIS — I5042 Chronic combined systolic (congestive) and diastolic (congestive) heart failure: Secondary | ICD-10-CM | POA: Diagnosis not present

## 2016-05-13 DIAGNOSIS — I428 Other cardiomyopathies: Secondary | ICD-10-CM | POA: Diagnosis not present

## 2016-05-13 DIAGNOSIS — I4891 Unspecified atrial fibrillation: Secondary | ICD-10-CM | POA: Diagnosis not present

## 2016-05-13 DIAGNOSIS — N189 Chronic kidney disease, unspecified: Secondary | ICD-10-CM | POA: Diagnosis not present

## 2016-05-13 DIAGNOSIS — E785 Hyperlipidemia, unspecified: Secondary | ICD-10-CM | POA: Diagnosis not present

## 2016-05-17 DIAGNOSIS — I5042 Chronic combined systolic (congestive) and diastolic (congestive) heart failure: Secondary | ICD-10-CM | POA: Diagnosis not present

## 2016-05-17 DIAGNOSIS — Z5181 Encounter for therapeutic drug level monitoring: Secondary | ICD-10-CM | POA: Diagnosis not present

## 2016-05-17 DIAGNOSIS — I130 Hypertensive heart and chronic kidney disease with heart failure and stage 1 through stage 4 chronic kidney disease, or unspecified chronic kidney disease: Secondary | ICD-10-CM | POA: Diagnosis not present

## 2016-05-17 DIAGNOSIS — Z7901 Long term (current) use of anticoagulants: Secondary | ICD-10-CM | POA: Diagnosis not present

## 2016-05-17 DIAGNOSIS — E785 Hyperlipidemia, unspecified: Secondary | ICD-10-CM | POA: Diagnosis not present

## 2016-05-17 DIAGNOSIS — D631 Anemia in chronic kidney disease: Secondary | ICD-10-CM | POA: Diagnosis not present

## 2016-05-17 DIAGNOSIS — I471 Supraventricular tachycardia: Secondary | ICD-10-CM | POA: Diagnosis not present

## 2016-05-17 DIAGNOSIS — N182 Chronic kidney disease, stage 2 (mild): Secondary | ICD-10-CM | POA: Diagnosis not present

## 2016-05-17 DIAGNOSIS — I428 Other cardiomyopathies: Secondary | ICD-10-CM | POA: Diagnosis not present

## 2016-05-17 DIAGNOSIS — Z95811 Presence of heart assist device: Secondary | ICD-10-CM | POA: Diagnosis not present

## 2016-05-17 DIAGNOSIS — I480 Paroxysmal atrial fibrillation: Secondary | ICD-10-CM | POA: Diagnosis not present

## 2016-05-17 DIAGNOSIS — Z48812 Encounter for surgical aftercare following surgery on the circulatory system: Secondary | ICD-10-CM | POA: Diagnosis not present

## 2016-05-17 DIAGNOSIS — Z9181 History of falling: Secondary | ICD-10-CM | POA: Diagnosis not present

## 2016-05-18 DIAGNOSIS — Z79899 Other long term (current) drug therapy: Secondary | ICD-10-CM | POA: Diagnosis not present

## 2016-05-18 DIAGNOSIS — I429 Cardiomyopathy, unspecified: Secondary | ICD-10-CM | POA: Diagnosis not present

## 2016-05-18 DIAGNOSIS — Z95811 Presence of heart assist device: Secondary | ICD-10-CM | POA: Diagnosis not present

## 2016-05-18 DIAGNOSIS — I10 Essential (primary) hypertension: Secondary | ICD-10-CM | POA: Diagnosis not present

## 2016-05-18 DIAGNOSIS — I340 Nonrheumatic mitral (valve) insufficiency: Secondary | ICD-10-CM | POA: Diagnosis not present

## 2016-05-18 DIAGNOSIS — I5189 Other ill-defined heart diseases: Secondary | ICD-10-CM | POA: Diagnosis not present

## 2016-05-18 DIAGNOSIS — Z7982 Long term (current) use of aspirin: Secondary | ICD-10-CM | POA: Diagnosis not present

## 2016-05-18 DIAGNOSIS — Z7901 Long term (current) use of anticoagulants: Secondary | ICD-10-CM | POA: Diagnosis not present

## 2016-05-18 DIAGNOSIS — I255 Ischemic cardiomyopathy: Secondary | ICD-10-CM | POA: Diagnosis not present

## 2016-05-20 DIAGNOSIS — D631 Anemia in chronic kidney disease: Secondary | ICD-10-CM | POA: Diagnosis not present

## 2016-05-20 DIAGNOSIS — I428 Other cardiomyopathies: Secondary | ICD-10-CM | POA: Diagnosis not present

## 2016-05-20 DIAGNOSIS — N182 Chronic kidney disease, stage 2 (mild): Secondary | ICD-10-CM | POA: Diagnosis not present

## 2016-05-20 DIAGNOSIS — Z48812 Encounter for surgical aftercare following surgery on the circulatory system: Secondary | ICD-10-CM | POA: Diagnosis not present

## 2016-05-20 DIAGNOSIS — I5042 Chronic combined systolic (congestive) and diastolic (congestive) heart failure: Secondary | ICD-10-CM | POA: Diagnosis not present

## 2016-05-20 DIAGNOSIS — I130 Hypertensive heart and chronic kidney disease with heart failure and stage 1 through stage 4 chronic kidney disease, or unspecified chronic kidney disease: Secondary | ICD-10-CM | POA: Diagnosis not present

## 2016-05-21 DIAGNOSIS — Z48812 Encounter for surgical aftercare following surgery on the circulatory system: Secondary | ICD-10-CM | POA: Diagnosis not present

## 2016-05-21 DIAGNOSIS — N182 Chronic kidney disease, stage 2 (mild): Secondary | ICD-10-CM | POA: Diagnosis not present

## 2016-05-21 DIAGNOSIS — I428 Other cardiomyopathies: Secondary | ICD-10-CM | POA: Diagnosis not present

## 2016-05-21 DIAGNOSIS — I130 Hypertensive heart and chronic kidney disease with heart failure and stage 1 through stage 4 chronic kidney disease, or unspecified chronic kidney disease: Secondary | ICD-10-CM | POA: Diagnosis not present

## 2016-05-21 DIAGNOSIS — I5042 Chronic combined systolic (congestive) and diastolic (congestive) heart failure: Secondary | ICD-10-CM | POA: Diagnosis not present

## 2016-05-21 DIAGNOSIS — D631 Anemia in chronic kidney disease: Secondary | ICD-10-CM | POA: Diagnosis not present

## 2016-05-27 DIAGNOSIS — N182 Chronic kidney disease, stage 2 (mild): Secondary | ICD-10-CM | POA: Diagnosis not present

## 2016-05-27 DIAGNOSIS — Z48812 Encounter for surgical aftercare following surgery on the circulatory system: Secondary | ICD-10-CM | POA: Diagnosis not present

## 2016-05-27 DIAGNOSIS — D631 Anemia in chronic kidney disease: Secondary | ICD-10-CM | POA: Diagnosis not present

## 2016-05-27 DIAGNOSIS — I428 Other cardiomyopathies: Secondary | ICD-10-CM | POA: Diagnosis not present

## 2016-05-27 DIAGNOSIS — I5042 Chronic combined systolic (congestive) and diastolic (congestive) heart failure: Secondary | ICD-10-CM | POA: Diagnosis not present

## 2016-05-27 DIAGNOSIS — I130 Hypertensive heart and chronic kidney disease with heart failure and stage 1 through stage 4 chronic kidney disease, or unspecified chronic kidney disease: Secondary | ICD-10-CM | POA: Diagnosis not present

## 2016-06-03 DIAGNOSIS — D631 Anemia in chronic kidney disease: Secondary | ICD-10-CM | POA: Diagnosis not present

## 2016-06-03 DIAGNOSIS — I5042 Chronic combined systolic (congestive) and diastolic (congestive) heart failure: Secondary | ICD-10-CM | POA: Diagnosis not present

## 2016-06-03 DIAGNOSIS — I130 Hypertensive heart and chronic kidney disease with heart failure and stage 1 through stage 4 chronic kidney disease, or unspecified chronic kidney disease: Secondary | ICD-10-CM | POA: Diagnosis not present

## 2016-06-03 DIAGNOSIS — Z48812 Encounter for surgical aftercare following surgery on the circulatory system: Secondary | ICD-10-CM | POA: Diagnosis not present

## 2016-06-03 DIAGNOSIS — N182 Chronic kidney disease, stage 2 (mild): Secondary | ICD-10-CM | POA: Diagnosis not present

## 2016-06-03 DIAGNOSIS — I428 Other cardiomyopathies: Secondary | ICD-10-CM | POA: Diagnosis not present

## 2016-06-10 DIAGNOSIS — I130 Hypertensive heart and chronic kidney disease with heart failure and stage 1 through stage 4 chronic kidney disease, or unspecified chronic kidney disease: Secondary | ICD-10-CM | POA: Diagnosis not present

## 2016-06-10 DIAGNOSIS — I471 Supraventricular tachycardia: Secondary | ICD-10-CM | POA: Diagnosis not present

## 2016-06-10 DIAGNOSIS — D631 Anemia in chronic kidney disease: Secondary | ICD-10-CM | POA: Diagnosis not present

## 2016-06-10 DIAGNOSIS — I5042 Chronic combined systolic (congestive) and diastolic (congestive) heart failure: Secondary | ICD-10-CM | POA: Diagnosis not present

## 2016-06-10 DIAGNOSIS — I480 Paroxysmal atrial fibrillation: Secondary | ICD-10-CM | POA: Diagnosis not present

## 2016-06-10 DIAGNOSIS — Z9181 History of falling: Secondary | ICD-10-CM | POA: Diagnosis not present

## 2016-06-10 DIAGNOSIS — E785 Hyperlipidemia, unspecified: Secondary | ICD-10-CM | POA: Diagnosis not present

## 2016-06-10 DIAGNOSIS — Z5181 Encounter for therapeutic drug level monitoring: Secondary | ICD-10-CM | POA: Diagnosis not present

## 2016-06-10 DIAGNOSIS — N182 Chronic kidney disease, stage 2 (mild): Secondary | ICD-10-CM | POA: Diagnosis not present

## 2016-06-10 DIAGNOSIS — Z95811 Presence of heart assist device: Secondary | ICD-10-CM | POA: Diagnosis not present

## 2016-06-10 DIAGNOSIS — Z48812 Encounter for surgical aftercare following surgery on the circulatory system: Secondary | ICD-10-CM | POA: Diagnosis not present

## 2016-06-10 DIAGNOSIS — Z7901 Long term (current) use of anticoagulants: Secondary | ICD-10-CM | POA: Diagnosis not present

## 2016-06-10 DIAGNOSIS — I428 Other cardiomyopathies: Secondary | ICD-10-CM | POA: Diagnosis not present

## 2016-06-17 DIAGNOSIS — D631 Anemia in chronic kidney disease: Secondary | ICD-10-CM | POA: Diagnosis not present

## 2016-06-17 DIAGNOSIS — Z48813 Encounter for surgical aftercare following surgery on the respiratory system: Secondary | ICD-10-CM | POA: Diagnosis not present

## 2016-06-17 DIAGNOSIS — I428 Other cardiomyopathies: Secondary | ICD-10-CM | POA: Diagnosis not present

## 2016-06-17 DIAGNOSIS — N182 Chronic kidney disease, stage 2 (mild): Secondary | ICD-10-CM | POA: Diagnosis not present

## 2016-06-17 DIAGNOSIS — Z48812 Encounter for surgical aftercare following surgery on the circulatory system: Secondary | ICD-10-CM | POA: Diagnosis not present

## 2016-06-17 DIAGNOSIS — I5042 Chronic combined systolic (congestive) and diastolic (congestive) heart failure: Secondary | ICD-10-CM | POA: Diagnosis not present

## 2016-06-17 DIAGNOSIS — I130 Hypertensive heart and chronic kidney disease with heart failure and stage 1 through stage 4 chronic kidney disease, or unspecified chronic kidney disease: Secondary | ICD-10-CM | POA: Diagnosis not present

## 2016-06-24 DIAGNOSIS — I130 Hypertensive heart and chronic kidney disease with heart failure and stage 1 through stage 4 chronic kidney disease, or unspecified chronic kidney disease: Secondary | ICD-10-CM | POA: Diagnosis not present

## 2016-06-24 DIAGNOSIS — I428 Other cardiomyopathies: Secondary | ICD-10-CM | POA: Diagnosis not present

## 2016-06-24 DIAGNOSIS — D631 Anemia in chronic kidney disease: Secondary | ICD-10-CM | POA: Diagnosis not present

## 2016-06-24 DIAGNOSIS — Z48812 Encounter for surgical aftercare following surgery on the circulatory system: Secondary | ICD-10-CM | POA: Diagnosis not present

## 2016-06-24 DIAGNOSIS — N182 Chronic kidney disease, stage 2 (mild): Secondary | ICD-10-CM | POA: Diagnosis not present

## 2016-06-24 DIAGNOSIS — I5042 Chronic combined systolic (congestive) and diastolic (congestive) heart failure: Secondary | ICD-10-CM | POA: Diagnosis not present

## 2016-06-26 DIAGNOSIS — Z48812 Encounter for surgical aftercare following surgery on the circulatory system: Secondary | ICD-10-CM | POA: Diagnosis not present

## 2016-06-26 DIAGNOSIS — D631 Anemia in chronic kidney disease: Secondary | ICD-10-CM | POA: Diagnosis not present

## 2016-06-26 DIAGNOSIS — I428 Other cardiomyopathies: Secondary | ICD-10-CM | POA: Diagnosis not present

## 2016-06-26 DIAGNOSIS — N182 Chronic kidney disease, stage 2 (mild): Secondary | ICD-10-CM | POA: Diagnosis not present

## 2016-06-26 DIAGNOSIS — I130 Hypertensive heart and chronic kidney disease with heart failure and stage 1 through stage 4 chronic kidney disease, or unspecified chronic kidney disease: Secondary | ICD-10-CM | POA: Diagnosis not present

## 2016-06-26 DIAGNOSIS — I5042 Chronic combined systolic (congestive) and diastolic (congestive) heart failure: Secondary | ICD-10-CM | POA: Diagnosis not present

## 2016-06-29 DIAGNOSIS — I130 Hypertensive heart and chronic kidney disease with heart failure and stage 1 through stage 4 chronic kidney disease, or unspecified chronic kidney disease: Secondary | ICD-10-CM | POA: Diagnosis not present

## 2016-06-29 DIAGNOSIS — D631 Anemia in chronic kidney disease: Secondary | ICD-10-CM | POA: Diagnosis not present

## 2016-06-29 DIAGNOSIS — I428 Other cardiomyopathies: Secondary | ICD-10-CM | POA: Diagnosis not present

## 2016-06-29 DIAGNOSIS — Z48812 Encounter for surgical aftercare following surgery on the circulatory system: Secondary | ICD-10-CM | POA: Diagnosis not present

## 2016-06-29 DIAGNOSIS — I5042 Chronic combined systolic (congestive) and diastolic (congestive) heart failure: Secondary | ICD-10-CM | POA: Diagnosis not present

## 2016-06-29 DIAGNOSIS — N182 Chronic kidney disease, stage 2 (mild): Secondary | ICD-10-CM | POA: Diagnosis not present

## 2016-07-08 DIAGNOSIS — Z5181 Encounter for therapeutic drug level monitoring: Secondary | ICD-10-CM | POA: Diagnosis not present

## 2016-07-08 DIAGNOSIS — Z48812 Encounter for surgical aftercare following surgery on the circulatory system: Secondary | ICD-10-CM | POA: Diagnosis not present

## 2016-07-08 DIAGNOSIS — I480 Paroxysmal atrial fibrillation: Secondary | ICD-10-CM | POA: Diagnosis not present

## 2016-07-08 DIAGNOSIS — E785 Hyperlipidemia, unspecified: Secondary | ICD-10-CM | POA: Diagnosis not present

## 2016-07-08 DIAGNOSIS — I5042 Chronic combined systolic (congestive) and diastolic (congestive) heart failure: Secondary | ICD-10-CM | POA: Diagnosis not present

## 2016-07-08 DIAGNOSIS — I471 Supraventricular tachycardia: Secondary | ICD-10-CM | POA: Diagnosis not present

## 2016-07-08 DIAGNOSIS — I428 Other cardiomyopathies: Secondary | ICD-10-CM | POA: Diagnosis not present

## 2016-07-08 DIAGNOSIS — Z7901 Long term (current) use of anticoagulants: Secondary | ICD-10-CM | POA: Diagnosis not present

## 2016-07-08 DIAGNOSIS — I130 Hypertensive heart and chronic kidney disease with heart failure and stage 1 through stage 4 chronic kidney disease, or unspecified chronic kidney disease: Secondary | ICD-10-CM | POA: Diagnosis not present

## 2016-07-08 DIAGNOSIS — N182 Chronic kidney disease, stage 2 (mild): Secondary | ICD-10-CM | POA: Diagnosis not present

## 2016-07-08 DIAGNOSIS — D631 Anemia in chronic kidney disease: Secondary | ICD-10-CM | POA: Diagnosis not present

## 2016-07-10 DIAGNOSIS — Z95811 Presence of heart assist device: Secondary | ICD-10-CM | POA: Diagnosis not present

## 2016-07-10 DIAGNOSIS — I428 Other cardiomyopathies: Secondary | ICD-10-CM | POA: Diagnosis not present

## 2016-07-10 DIAGNOSIS — Z48812 Encounter for surgical aftercare following surgery on the circulatory system: Secondary | ICD-10-CM | POA: Diagnosis not present

## 2016-07-10 DIAGNOSIS — Z4801 Encounter for change or removal of surgical wound dressing: Secondary | ICD-10-CM | POA: Diagnosis not present

## 2016-07-15 DIAGNOSIS — Z95811 Presence of heart assist device: Secondary | ICD-10-CM | POA: Diagnosis not present

## 2016-07-15 DIAGNOSIS — Z7901 Long term (current) use of anticoagulants: Secondary | ICD-10-CM | POA: Diagnosis not present

## 2016-07-22 DIAGNOSIS — I130 Hypertensive heart and chronic kidney disease with heart failure and stage 1 through stage 4 chronic kidney disease, or unspecified chronic kidney disease: Secondary | ICD-10-CM | POA: Diagnosis not present

## 2016-07-22 DIAGNOSIS — Z48812 Encounter for surgical aftercare following surgery on the circulatory system: Secondary | ICD-10-CM | POA: Diagnosis not present

## 2016-07-29 DIAGNOSIS — I5020 Unspecified systolic (congestive) heart failure: Secondary | ICD-10-CM | POA: Diagnosis not present

## 2016-08-05 DIAGNOSIS — Z7901 Long term (current) use of anticoagulants: Secondary | ICD-10-CM | POA: Diagnosis not present

## 2016-08-12 DIAGNOSIS — I5020 Unspecified systolic (congestive) heart failure: Secondary | ICD-10-CM | POA: Diagnosis not present

## 2016-08-19 DIAGNOSIS — I5020 Unspecified systolic (congestive) heart failure: Secondary | ICD-10-CM | POA: Diagnosis not present

## 2016-08-26 DIAGNOSIS — I5020 Unspecified systolic (congestive) heart failure: Secondary | ICD-10-CM | POA: Diagnosis not present

## 2016-09-02 DIAGNOSIS — I5020 Unspecified systolic (congestive) heart failure: Secondary | ICD-10-CM | POA: Diagnosis not present

## 2016-09-15 DIAGNOSIS — I428 Other cardiomyopathies: Secondary | ICD-10-CM | POA: Diagnosis not present

## 2016-09-15 DIAGNOSIS — Z95811 Presence of heart assist device: Secondary | ICD-10-CM | POA: Diagnosis not present

## 2016-09-15 DIAGNOSIS — Z48812 Encounter for surgical aftercare following surgery on the circulatory system: Secondary | ICD-10-CM | POA: Diagnosis not present

## 2016-09-15 DIAGNOSIS — Z4801 Encounter for change or removal of surgical wound dressing: Secondary | ICD-10-CM | POA: Diagnosis not present

## 2016-09-16 DIAGNOSIS — Z09 Encounter for follow-up examination after completed treatment for conditions other than malignant neoplasm: Secondary | ICD-10-CM | POA: Diagnosis not present

## 2016-09-16 DIAGNOSIS — I351 Nonrheumatic aortic (valve) insufficiency: Secondary | ICD-10-CM | POA: Diagnosis not present

## 2016-09-16 DIAGNOSIS — J929 Pleural plaque without asbestos: Secondary | ICD-10-CM | POA: Diagnosis not present

## 2016-09-16 DIAGNOSIS — Z7901 Long term (current) use of anticoagulants: Secondary | ICD-10-CM | POA: Diagnosis not present

## 2016-09-16 DIAGNOSIS — I517 Cardiomegaly: Secondary | ICD-10-CM | POA: Diagnosis not present

## 2016-09-16 DIAGNOSIS — Z006 Encounter for examination for normal comparison and control in clinical research program: Secondary | ICD-10-CM | POA: Diagnosis not present

## 2016-09-16 DIAGNOSIS — Z95811 Presence of heart assist device: Secondary | ICD-10-CM | POA: Diagnosis not present

## 2016-09-16 DIAGNOSIS — R918 Other nonspecific abnormal finding of lung field: Secondary | ICD-10-CM | POA: Diagnosis not present

## 2016-09-23 DIAGNOSIS — I5020 Unspecified systolic (congestive) heart failure: Secondary | ICD-10-CM | POA: Diagnosis not present

## 2016-09-25 DIAGNOSIS — I5020 Unspecified systolic (congestive) heart failure: Secondary | ICD-10-CM | POA: Diagnosis not present

## 2016-09-30 DIAGNOSIS — Z7901 Long term (current) use of anticoagulants: Secondary | ICD-10-CM | POA: Diagnosis not present

## 2016-10-07 DIAGNOSIS — I5020 Unspecified systolic (congestive) heart failure: Secondary | ICD-10-CM | POA: Diagnosis not present

## 2016-11-04 DIAGNOSIS — Z7901 Long term (current) use of anticoagulants: Secondary | ICD-10-CM | POA: Diagnosis not present

## 2016-11-05 DIAGNOSIS — Z95811 Presence of heart assist device: Secondary | ICD-10-CM | POA: Diagnosis not present

## 2016-11-05 DIAGNOSIS — Z4801 Encounter for change or removal of surgical wound dressing: Secondary | ICD-10-CM | POA: Diagnosis not present

## 2016-11-05 DIAGNOSIS — I428 Other cardiomyopathies: Secondary | ICD-10-CM | POA: Diagnosis not present

## 2016-11-05 DIAGNOSIS — Z48812 Encounter for surgical aftercare following surgery on the circulatory system: Secondary | ICD-10-CM | POA: Diagnosis not present

## 2016-11-09 DIAGNOSIS — I130 Hypertensive heart and chronic kidney disease with heart failure and stage 1 through stage 4 chronic kidney disease, or unspecified chronic kidney disease: Secondary | ICD-10-CM | POA: Diagnosis not present

## 2016-11-09 DIAGNOSIS — R9431 Abnormal electrocardiogram [ECG] [EKG]: Secondary | ICD-10-CM | POA: Diagnosis not present

## 2016-11-09 DIAGNOSIS — I499 Cardiac arrhythmia, unspecified: Secondary | ICD-10-CM | POA: Diagnosis not present

## 2016-11-09 DIAGNOSIS — I10 Essential (primary) hypertension: Secondary | ICD-10-CM | POA: Diagnosis not present

## 2016-11-09 DIAGNOSIS — Z95811 Presence of heart assist device: Secondary | ICD-10-CM | POA: Diagnosis not present

## 2016-11-09 DIAGNOSIS — Z8679 Personal history of other diseases of the circulatory system: Secondary | ICD-10-CM | POA: Diagnosis not present

## 2016-11-09 DIAGNOSIS — I428 Other cardiomyopathies: Secondary | ICD-10-CM | POA: Diagnosis not present

## 2016-11-09 DIAGNOSIS — I5022 Chronic systolic (congestive) heart failure: Secondary | ICD-10-CM | POA: Diagnosis not present

## 2016-11-09 DIAGNOSIS — Z7901 Long term (current) use of anticoagulants: Secondary | ICD-10-CM | POA: Diagnosis not present

## 2016-11-09 DIAGNOSIS — N182 Chronic kidney disease, stage 2 (mild): Secondary | ICD-10-CM | POA: Diagnosis not present

## 2016-11-09 DIAGNOSIS — Z4509 Encounter for adjustment and management of other cardiac device: Secondary | ICD-10-CM | POA: Diagnosis not present

## 2016-11-09 DIAGNOSIS — I480 Paroxysmal atrial fibrillation: Secondary | ICD-10-CM | POA: Diagnosis not present

## 2016-12-10 DIAGNOSIS — Z7901 Long term (current) use of anticoagulants: Secondary | ICD-10-CM | POA: Diagnosis not present

## 2017-01-08 DIAGNOSIS — Z7901 Long term (current) use of anticoagulants: Secondary | ICD-10-CM | POA: Diagnosis not present

## 2017-01-20 DIAGNOSIS — Z7901 Long term (current) use of anticoagulants: Secondary | ICD-10-CM | POA: Diagnosis not present

## 2017-01-20 DIAGNOSIS — I480 Paroxysmal atrial fibrillation: Secondary | ICD-10-CM | POA: Diagnosis not present

## 2017-01-20 DIAGNOSIS — Z95811 Presence of heart assist device: Secondary | ICD-10-CM | POA: Diagnosis not present

## 2017-01-20 DIAGNOSIS — Z4509 Encounter for adjustment and management of other cardiac device: Secondary | ICD-10-CM | POA: Diagnosis not present

## 2017-01-20 DIAGNOSIS — I5022 Chronic systolic (congestive) heart failure: Secondary | ICD-10-CM | POA: Diagnosis not present

## 2017-01-20 DIAGNOSIS — I493 Ventricular premature depolarization: Secondary | ICD-10-CM | POA: Diagnosis not present

## 2017-01-20 DIAGNOSIS — I10 Essential (primary) hypertension: Secondary | ICD-10-CM | POA: Diagnosis not present

## 2017-01-20 DIAGNOSIS — Z7982 Long term (current) use of aspirin: Secondary | ICD-10-CM | POA: Diagnosis not present

## 2017-01-20 DIAGNOSIS — E782 Mixed hyperlipidemia: Secondary | ICD-10-CM | POA: Diagnosis not present

## 2017-01-20 DIAGNOSIS — I4581 Long QT syndrome: Secondary | ICD-10-CM | POA: Diagnosis not present

## 2017-01-20 DIAGNOSIS — I351 Nonrheumatic aortic (valve) insufficiency: Secondary | ICD-10-CM | POA: Diagnosis not present

## 2017-01-20 DIAGNOSIS — I429 Cardiomyopathy, unspecified: Secondary | ICD-10-CM | POA: Diagnosis not present

## 2017-01-20 DIAGNOSIS — E8770 Fluid overload, unspecified: Secondary | ICD-10-CM | POA: Diagnosis not present

## 2017-01-20 DIAGNOSIS — I5023 Acute on chronic systolic (congestive) heart failure: Secondary | ICD-10-CM | POA: Diagnosis not present

## 2017-01-20 DIAGNOSIS — I428 Other cardiomyopathies: Secondary | ICD-10-CM | POA: Diagnosis not present

## 2017-01-20 DIAGNOSIS — I499 Cardiac arrhythmia, unspecified: Secondary | ICD-10-CM | POA: Diagnosis not present

## 2017-01-20 DIAGNOSIS — R008 Other abnormalities of heart beat: Secondary | ICD-10-CM | POA: Diagnosis not present

## 2017-01-21 DIAGNOSIS — Z48812 Encounter for surgical aftercare following surgery on the circulatory system: Secondary | ICD-10-CM | POA: Diagnosis not present

## 2017-01-21 DIAGNOSIS — I428 Other cardiomyopathies: Secondary | ICD-10-CM | POA: Diagnosis not present

## 2017-01-21 DIAGNOSIS — Z95811 Presence of heart assist device: Secondary | ICD-10-CM | POA: Diagnosis not present

## 2017-01-21 DIAGNOSIS — Z4801 Encounter for change or removal of surgical wound dressing: Secondary | ICD-10-CM | POA: Diagnosis not present

## 2017-02-12 DIAGNOSIS — Z7901 Long term (current) use of anticoagulants: Secondary | ICD-10-CM | POA: Diagnosis not present

## 2017-03-17 DIAGNOSIS — E782 Mixed hyperlipidemia: Secondary | ICD-10-CM | POA: Diagnosis not present

## 2017-03-17 DIAGNOSIS — Z7901 Long term (current) use of anticoagulants: Secondary | ICD-10-CM | POA: Diagnosis not present

## 2017-03-17 DIAGNOSIS — I110 Hypertensive heart disease with heart failure: Secondary | ICD-10-CM | POA: Diagnosis not present

## 2017-03-17 DIAGNOSIS — I4581 Long QT syndrome: Secondary | ICD-10-CM | POA: Diagnosis not present

## 2017-03-17 DIAGNOSIS — I5022 Chronic systolic (congestive) heart failure: Secondary | ICD-10-CM | POA: Diagnosis not present

## 2017-03-17 DIAGNOSIS — I428 Other cardiomyopathies: Secondary | ICD-10-CM | POA: Diagnosis not present

## 2017-03-17 DIAGNOSIS — Z95811 Presence of heart assist device: Secondary | ICD-10-CM | POA: Diagnosis not present

## 2017-03-17 DIAGNOSIS — I493 Ventricular premature depolarization: Secondary | ICD-10-CM | POA: Diagnosis not present

## 2017-03-17 DIAGNOSIS — I499 Cardiac arrhythmia, unspecified: Secondary | ICD-10-CM | POA: Diagnosis not present

## 2017-03-22 DIAGNOSIS — Z4801 Encounter for change or removal of surgical wound dressing: Secondary | ICD-10-CM | POA: Diagnosis not present

## 2017-03-22 DIAGNOSIS — Z48812 Encounter for surgical aftercare following surgery on the circulatory system: Secondary | ICD-10-CM | POA: Diagnosis not present

## 2017-03-22 DIAGNOSIS — Z95811 Presence of heart assist device: Secondary | ICD-10-CM | POA: Diagnosis not present

## 2017-03-22 DIAGNOSIS — I428 Other cardiomyopathies: Secondary | ICD-10-CM | POA: Diagnosis not present

## 2017-04-06 DIAGNOSIS — Z7901 Long term (current) use of anticoagulants: Secondary | ICD-10-CM | POA: Diagnosis not present

## 2017-05-11 DIAGNOSIS — I5020 Unspecified systolic (congestive) heart failure: Secondary | ICD-10-CM | POA: Diagnosis not present

## 2017-05-14 DIAGNOSIS — Z4801 Encounter for change or removal of surgical wound dressing: Secondary | ICD-10-CM | POA: Diagnosis not present

## 2017-05-14 DIAGNOSIS — Z95811 Presence of heart assist device: Secondary | ICD-10-CM | POA: Diagnosis not present

## 2017-05-14 DIAGNOSIS — I428 Other cardiomyopathies: Secondary | ICD-10-CM | POA: Diagnosis not present

## 2017-05-14 DIAGNOSIS — Z7901 Long term (current) use of anticoagulants: Secondary | ICD-10-CM | POA: Diagnosis not present

## 2017-05-14 DIAGNOSIS — Z48812 Encounter for surgical aftercare following surgery on the circulatory system: Secondary | ICD-10-CM | POA: Diagnosis not present

## 2017-06-15 DIAGNOSIS — I5022 Chronic systolic (congestive) heart failure: Secondary | ICD-10-CM | POA: Diagnosis not present

## 2017-06-15 DIAGNOSIS — I10 Essential (primary) hypertension: Secondary | ICD-10-CM | POA: Diagnosis not present

## 2017-06-15 DIAGNOSIS — Z79899 Other long term (current) drug therapy: Secondary | ICD-10-CM | POA: Diagnosis not present

## 2017-06-15 DIAGNOSIS — I110 Hypertensive heart disease with heart failure: Secondary | ICD-10-CM | POA: Diagnosis not present

## 2017-06-15 DIAGNOSIS — I351 Nonrheumatic aortic (valve) insufficiency: Secondary | ICD-10-CM | POA: Diagnosis not present

## 2017-06-15 DIAGNOSIS — Z4509 Encounter for adjustment and management of other cardiac device: Secondary | ICD-10-CM | POA: Diagnosis not present

## 2017-06-15 DIAGNOSIS — I493 Ventricular premature depolarization: Secondary | ICD-10-CM | POA: Diagnosis not present

## 2017-06-15 DIAGNOSIS — Z7901 Long term (current) use of anticoagulants: Secondary | ICD-10-CM | POA: Diagnosis not present

## 2017-06-15 DIAGNOSIS — Z95811 Presence of heart assist device: Secondary | ICD-10-CM | POA: Diagnosis not present

## 2017-06-15 DIAGNOSIS — I429 Cardiomyopathy, unspecified: Secondary | ICD-10-CM | POA: Diagnosis not present

## 2017-07-02 DIAGNOSIS — Z7901 Long term (current) use of anticoagulants: Secondary | ICD-10-CM | POA: Diagnosis not present

## 2017-07-21 DIAGNOSIS — Z4801 Encounter for change or removal of surgical wound dressing: Secondary | ICD-10-CM | POA: Diagnosis not present

## 2017-07-21 DIAGNOSIS — Z48812 Encounter for surgical aftercare following surgery on the circulatory system: Secondary | ICD-10-CM | POA: Diagnosis not present

## 2017-07-21 DIAGNOSIS — I428 Other cardiomyopathies: Secondary | ICD-10-CM | POA: Diagnosis not present

## 2017-07-21 DIAGNOSIS — Z95811 Presence of heart assist device: Secondary | ICD-10-CM | POA: Diagnosis not present

## 2017-08-12 DIAGNOSIS — Z7901 Long term (current) use of anticoagulants: Secondary | ICD-10-CM | POA: Diagnosis not present

## 2017-09-21 DIAGNOSIS — Z4801 Encounter for change or removal of surgical wound dressing: Secondary | ICD-10-CM | POA: Diagnosis not present

## 2017-09-21 DIAGNOSIS — I428 Other cardiomyopathies: Secondary | ICD-10-CM | POA: Diagnosis not present

## 2017-09-21 DIAGNOSIS — Z48812 Encounter for surgical aftercare following surgery on the circulatory system: Secondary | ICD-10-CM | POA: Diagnosis not present

## 2017-09-21 DIAGNOSIS — Z95811 Presence of heart assist device: Secondary | ICD-10-CM | POA: Diagnosis not present

## 2017-09-22 DIAGNOSIS — I110 Hypertensive heart disease with heart failure: Secondary | ICD-10-CM | POA: Diagnosis not present

## 2017-09-22 DIAGNOSIS — I5022 Chronic systolic (congestive) heart failure: Secondary | ICD-10-CM | POA: Diagnosis not present

## 2017-09-22 DIAGNOSIS — Z95811 Presence of heart assist device: Secondary | ICD-10-CM | POA: Diagnosis not present

## 2017-09-22 DIAGNOSIS — I493 Ventricular premature depolarization: Secondary | ICD-10-CM | POA: Diagnosis not present

## 2017-09-22 DIAGNOSIS — Z7901 Long term (current) use of anticoagulants: Secondary | ICD-10-CM | POA: Diagnosis not present

## 2017-09-22 DIAGNOSIS — Z4509 Encounter for adjustment and management of other cardiac device: Secondary | ICD-10-CM | POA: Diagnosis not present

## 2017-09-22 DIAGNOSIS — I428 Other cardiomyopathies: Secondary | ICD-10-CM | POA: Diagnosis not present

## 2017-10-08 DIAGNOSIS — Z7901 Long term (current) use of anticoagulants: Secondary | ICD-10-CM | POA: Diagnosis not present

## 2017-11-18 DIAGNOSIS — Z7901 Long term (current) use of anticoagulants: Secondary | ICD-10-CM | POA: Diagnosis not present

## 2017-11-24 DIAGNOSIS — Z7982 Long term (current) use of aspirin: Secondary | ICD-10-CM | POA: Diagnosis not present

## 2017-11-24 DIAGNOSIS — Z95811 Presence of heart assist device: Secondary | ICD-10-CM | POA: Diagnosis not present

## 2017-11-24 DIAGNOSIS — I4581 Long QT syndrome: Secondary | ICD-10-CM | POA: Diagnosis not present

## 2017-11-24 DIAGNOSIS — R008 Other abnormalities of heart beat: Secondary | ICD-10-CM | POA: Diagnosis not present

## 2017-11-24 DIAGNOSIS — I5022 Chronic systolic (congestive) heart failure: Secondary | ICD-10-CM | POA: Diagnosis not present

## 2017-11-24 DIAGNOSIS — I493 Ventricular premature depolarization: Secondary | ICD-10-CM | POA: Diagnosis not present

## 2017-11-24 DIAGNOSIS — I130 Hypertensive heart and chronic kidney disease with heart failure and stage 1 through stage 4 chronic kidney disease, or unspecified chronic kidney disease: Secondary | ICD-10-CM | POA: Diagnosis not present

## 2017-11-24 DIAGNOSIS — I428 Other cardiomyopathies: Secondary | ICD-10-CM | POA: Diagnosis not present

## 2017-11-24 DIAGNOSIS — N182 Chronic kidney disease, stage 2 (mild): Secondary | ICD-10-CM | POA: Diagnosis not present

## 2017-11-24 DIAGNOSIS — Z79899 Other long term (current) drug therapy: Secondary | ICD-10-CM | POA: Diagnosis not present

## 2017-11-24 DIAGNOSIS — Z7901 Long term (current) use of anticoagulants: Secondary | ICD-10-CM | POA: Diagnosis not present

## 2017-11-24 DIAGNOSIS — Z4509 Encounter for adjustment and management of other cardiac device: Secondary | ICD-10-CM | POA: Diagnosis not present

## 2017-11-24 DIAGNOSIS — Z8719 Personal history of other diseases of the digestive system: Secondary | ICD-10-CM | POA: Diagnosis not present

## 2017-12-22 DIAGNOSIS — Z7901 Long term (current) use of anticoagulants: Secondary | ICD-10-CM | POA: Diagnosis not present

## 2018-02-01 DIAGNOSIS — Z7901 Long term (current) use of anticoagulants: Secondary | ICD-10-CM | POA: Diagnosis not present

## 2018-02-16 DIAGNOSIS — I428 Other cardiomyopathies: Secondary | ICD-10-CM | POA: Diagnosis not present

## 2018-02-16 DIAGNOSIS — Z95811 Presence of heart assist device: Secondary | ICD-10-CM | POA: Diagnosis not present

## 2018-02-16 DIAGNOSIS — Z48812 Encounter for surgical aftercare following surgery on the circulatory system: Secondary | ICD-10-CM | POA: Diagnosis not present

## 2018-02-16 DIAGNOSIS — Z4801 Encounter for change or removal of surgical wound dressing: Secondary | ICD-10-CM | POA: Diagnosis not present

## 2018-02-21 DIAGNOSIS — I493 Ventricular premature depolarization: Secondary | ICD-10-CM | POA: Diagnosis not present

## 2018-02-21 DIAGNOSIS — I5022 Chronic systolic (congestive) heart failure: Secondary | ICD-10-CM | POA: Diagnosis not present

## 2018-02-21 DIAGNOSIS — I10 Essential (primary) hypertension: Secondary | ICD-10-CM | POA: Diagnosis not present

## 2018-02-21 DIAGNOSIS — Z4509 Encounter for adjustment and management of other cardiac device: Secondary | ICD-10-CM | POA: Diagnosis not present

## 2018-02-21 DIAGNOSIS — Z95811 Presence of heart assist device: Secondary | ICD-10-CM | POA: Diagnosis not present

## 2018-02-21 DIAGNOSIS — Z7982 Long term (current) use of aspirin: Secondary | ICD-10-CM | POA: Diagnosis not present

## 2018-02-21 DIAGNOSIS — I428 Other cardiomyopathies: Secondary | ICD-10-CM | POA: Diagnosis not present

## 2018-02-21 DIAGNOSIS — I110 Hypertensive heart disease with heart failure: Secondary | ICD-10-CM | POA: Diagnosis not present

## 2018-02-21 DIAGNOSIS — Z7901 Long term (current) use of anticoagulants: Secondary | ICD-10-CM | POA: Diagnosis not present

## 2018-02-21 DIAGNOSIS — I4581 Long QT syndrome: Secondary | ICD-10-CM | POA: Diagnosis not present

## 2018-02-21 DIAGNOSIS — Z79899 Other long term (current) drug therapy: Secondary | ICD-10-CM | POA: Diagnosis not present

## 2018-02-22 DIAGNOSIS — Z48812 Encounter for surgical aftercare following surgery on the circulatory system: Secondary | ICD-10-CM | POA: Diagnosis not present

## 2018-02-22 DIAGNOSIS — Z4801 Encounter for change or removal of surgical wound dressing: Secondary | ICD-10-CM | POA: Diagnosis not present

## 2018-02-22 DIAGNOSIS — I428 Other cardiomyopathies: Secondary | ICD-10-CM | POA: Diagnosis not present

## 2018-02-22 DIAGNOSIS — Z95811 Presence of heart assist device: Secondary | ICD-10-CM | POA: Diagnosis not present

## 2018-03-08 DIAGNOSIS — Z7901 Long term (current) use of anticoagulants: Secondary | ICD-10-CM | POA: Diagnosis not present

## 2018-03-24 ENCOUNTER — Other Ambulatory Visit: Payer: Self-pay

## 2018-03-25 DIAGNOSIS — Z95811 Presence of heart assist device: Secondary | ICD-10-CM | POA: Diagnosis not present

## 2018-03-25 DIAGNOSIS — Z4801 Encounter for change or removal of surgical wound dressing: Secondary | ICD-10-CM | POA: Diagnosis not present

## 2018-03-25 DIAGNOSIS — I428 Other cardiomyopathies: Secondary | ICD-10-CM | POA: Diagnosis not present

## 2018-03-25 DIAGNOSIS — Z48812 Encounter for surgical aftercare following surgery on the circulatory system: Secondary | ICD-10-CM | POA: Diagnosis not present

## 2018-04-15 DIAGNOSIS — Z7901 Long term (current) use of anticoagulants: Secondary | ICD-10-CM | POA: Diagnosis not present

## 2018-05-24 DIAGNOSIS — Z7901 Long term (current) use of anticoagulants: Secondary | ICD-10-CM | POA: Diagnosis not present

## 2018-05-24 DIAGNOSIS — I5022 Chronic systolic (congestive) heart failure: Secondary | ICD-10-CM | POA: Diagnosis not present

## 2018-05-24 DIAGNOSIS — Z7982 Long term (current) use of aspirin: Secondary | ICD-10-CM | POA: Diagnosis not present

## 2018-05-24 DIAGNOSIS — Z79899 Other long term (current) drug therapy: Secondary | ICD-10-CM | POA: Diagnosis not present

## 2018-05-24 DIAGNOSIS — Z95811 Presence of heart assist device: Secondary | ICD-10-CM | POA: Diagnosis not present

## 2018-05-24 DIAGNOSIS — Z4509 Encounter for adjustment and management of other cardiac device: Secondary | ICD-10-CM | POA: Diagnosis not present

## 2018-05-24 DIAGNOSIS — R001 Bradycardia, unspecified: Secondary | ICD-10-CM | POA: Diagnosis not present

## 2018-05-24 DIAGNOSIS — I493 Ventricular premature depolarization: Secondary | ICD-10-CM | POA: Diagnosis not present

## 2018-05-24 DIAGNOSIS — I10 Essential (primary) hypertension: Secondary | ICD-10-CM | POA: Diagnosis not present

## 2018-05-24 DIAGNOSIS — I080 Rheumatic disorders of both mitral and aortic valves: Secondary | ICD-10-CM | POA: Diagnosis not present

## 2018-05-24 DIAGNOSIS — E861 Hypovolemia: Secondary | ICD-10-CM | POA: Diagnosis not present

## 2018-06-02 DIAGNOSIS — Z7901 Long term (current) use of anticoagulants: Secondary | ICD-10-CM | POA: Diagnosis not present

## 2018-06-07 DIAGNOSIS — Z48812 Encounter for surgical aftercare following surgery on the circulatory system: Secondary | ICD-10-CM | POA: Diagnosis not present

## 2018-06-07 DIAGNOSIS — Z4801 Encounter for change or removal of surgical wound dressing: Secondary | ICD-10-CM | POA: Diagnosis not present

## 2018-06-07 DIAGNOSIS — Z95811 Presence of heart assist device: Secondary | ICD-10-CM | POA: Diagnosis not present

## 2018-06-07 DIAGNOSIS — I428 Other cardiomyopathies: Secondary | ICD-10-CM | POA: Diagnosis not present

## 2018-06-29 DIAGNOSIS — Z7901 Long term (current) use of anticoagulants: Secondary | ICD-10-CM | POA: Diagnosis not present

## 2018-08-15 DIAGNOSIS — Z7901 Long term (current) use of anticoagulants: Secondary | ICD-10-CM | POA: Diagnosis not present

## 2020-03-06 ENCOUNTER — Emergency Department (HOSPITAL_COMMUNITY): Payer: Medicare Other

## 2020-03-06 ENCOUNTER — Inpatient Hospital Stay (HOSPITAL_COMMUNITY)
Admission: EM | Admit: 2020-03-06 | Discharge: 2020-03-07 | DRG: 814 | Disposition: A | Discharge status: Other Acute Inpatient Hospital | Payer: Medicare Other | Attending: Pulmonary Disease | Admitting: Pulmonary Disease

## 2020-03-06 DIAGNOSIS — I480 Paroxysmal atrial fibrillation: Secondary | ICD-10-CM | POA: Diagnosis present

## 2020-03-06 DIAGNOSIS — I130 Hypertensive heart and chronic kidney disease with heart failure and stage 1 through stage 4 chronic kidney disease, or unspecified chronic kidney disease: Secondary | ICD-10-CM | POA: Diagnosis present

## 2020-03-06 DIAGNOSIS — N182 Chronic kidney disease, stage 2 (mild): Secondary | ICD-10-CM | POA: Diagnosis present

## 2020-03-06 DIAGNOSIS — Z7901 Long term (current) use of anticoagulants: Secondary | ICD-10-CM | POA: Diagnosis not present

## 2020-03-06 DIAGNOSIS — R1084 Generalized abdominal pain: Secondary | ICD-10-CM

## 2020-03-06 DIAGNOSIS — K661 Hemoperitoneum: Secondary | ICD-10-CM

## 2020-03-06 DIAGNOSIS — D62 Acute posthemorrhagic anemia: Secondary | ICD-10-CM | POA: Diagnosis present

## 2020-03-06 DIAGNOSIS — S36039A Unspecified laceration of spleen, initial encounter: Secondary | ICD-10-CM

## 2020-03-06 DIAGNOSIS — Z95811 Presence of heart assist device: Secondary | ICD-10-CM

## 2020-03-06 DIAGNOSIS — I428 Other cardiomyopathies: Secondary | ICD-10-CM | POA: Diagnosis present

## 2020-03-06 DIAGNOSIS — S36031A Moderate laceration of spleen, initial encounter: Secondary | ICD-10-CM | POA: Diagnosis present

## 2020-03-06 DIAGNOSIS — S36116A Major laceration of liver, initial encounter: Secondary | ICD-10-CM | POA: Diagnosis not present

## 2020-03-06 DIAGNOSIS — I5022 Chronic systolic (congestive) heart failure: Secondary | ICD-10-CM | POA: Diagnosis present

## 2020-03-06 DIAGNOSIS — R1010 Upper abdominal pain, unspecified: Secondary | ICD-10-CM | POA: Diagnosis not present

## 2020-03-06 DIAGNOSIS — Z20822 Contact with and (suspected) exposure to covid-19: Secondary | ICD-10-CM | POA: Diagnosis present

## 2020-03-06 DIAGNOSIS — S31109A Unspecified open wound of abdominal wall, unspecified quadrant without penetration into peritoneal cavity, initial encounter: Secondary | ICD-10-CM

## 2020-03-06 LAB — PROTIME-INR
INR: 1.9 — ABNORMAL HIGH (ref 0.8–1.2)
Prothrombin Time: 21.2 seconds — ABNORMAL HIGH (ref 11.4–15.2)

## 2020-03-06 LAB — CBC WITH DIFFERENTIAL/PLATELET
Abs Immature Granulocytes: 0.09 10*3/uL — ABNORMAL HIGH (ref 0.00–0.07)
Basophils Absolute: 0 10*3/uL (ref 0.0–0.1)
Basophils Relative: 0 %
Eosinophils Absolute: 0.1 10*3/uL (ref 0.0–0.5)
Eosinophils Relative: 1 %
HCT: 27.3 % — ABNORMAL LOW (ref 39.0–52.0)
Hemoglobin: 8.3 g/dL — ABNORMAL LOW (ref 13.0–17.0)
Immature Granulocytes: 1 %
Lymphocytes Relative: 4 %
Lymphs Abs: 0.4 10*3/uL — ABNORMAL LOW (ref 0.7–4.0)
MCH: 24.9 pg — ABNORMAL LOW (ref 26.0–34.0)
MCHC: 30.4 g/dL (ref 30.0–36.0)
MCV: 82 fL (ref 80.0–100.0)
Monocytes Absolute: 0.4 10*3/uL (ref 0.1–1.0)
Monocytes Relative: 4 %
Neutro Abs: 10.5 10*3/uL — ABNORMAL HIGH (ref 1.7–7.7)
Neutrophils Relative %: 90 %
Platelets: 155 10*3/uL (ref 150–400)
RBC: 3.33 MIL/uL — ABNORMAL LOW (ref 4.22–5.81)
RDW: 16.6 % — ABNORMAL HIGH (ref 11.5–15.5)
WBC: 11.6 10*3/uL — ABNORMAL HIGH (ref 4.0–10.5)
nRBC: 0 % (ref 0.0–0.2)

## 2020-03-06 LAB — COMPREHENSIVE METABOLIC PANEL
ALT: 40 U/L (ref 0–44)
AST: 36 U/L (ref 15–41)
Albumin: 2.2 g/dL — ABNORMAL LOW (ref 3.5–5.0)
Alkaline Phosphatase: 244 U/L — ABNORMAL HIGH (ref 38–126)
Anion gap: 10 (ref 5–15)
BUN: 13 mg/dL (ref 8–23)
CO2: 26 mmol/L (ref 22–32)
Calcium: 8 mg/dL — ABNORMAL LOW (ref 8.9–10.3)
Chloride: 103 mmol/L (ref 98–111)
Creatinine, Ser: 1.02 mg/dL (ref 0.61–1.24)
GFR calc Af Amer: >60 mL/min (ref >60)
GFR calc non Af Amer: >60 mL/min (ref >60)
Glucose, Bld: 147 mg/dL — ABNORMAL HIGH (ref 70–99)
Potassium: 3.5 mmol/L (ref 3.5–5.1)
Sodium: 139 mmol/L (ref 135–145)
Total Bilirubin: 1.5 mg/dL — ABNORMAL HIGH (ref 0.3–1.2)
Total Protein: 5.8 g/dL — ABNORMAL LOW (ref 6.5–8.1)

## 2020-03-06 LAB — TYPE AND SCREEN
ABO/RH(D): A POS
Antibody Screen: NEGATIVE

## 2020-03-06 LAB — CBC
HCT: 29.3 % — ABNORMAL LOW (ref 39.0–52.0)
Hemoglobin: 8.9 g/dL — ABNORMAL LOW (ref 13.0–17.0)
MCH: 25 pg — ABNORMAL LOW (ref 26.0–34.0)
MCHC: 30.4 g/dL (ref 30.0–36.0)
MCV: 82.3 fL (ref 80.0–100.0)
Platelets: 164 10*3/uL (ref 150–400)
RBC: 3.56 MIL/uL — ABNORMAL LOW (ref 4.22–5.81)
RDW: 16.8 % — ABNORMAL HIGH (ref 11.5–15.5)
WBC: 11.9 10*3/uL — ABNORMAL HIGH (ref 4.0–10.5)
nRBC: 0 % (ref 0.0–0.2)

## 2020-03-06 LAB — ABO/RH: ABO/RH(D): A POS

## 2020-03-06 LAB — LACTATE DEHYDROGENASE: LDH: 187 U/L (ref 98–192)

## 2020-03-06 MED ORDER — AMIODARONE HCL 200 MG PO TABS: 200.0000 mg | ORAL_TABLET | Freq: Every day | ORAL | Status: DC

## 2020-03-06 MED ORDER — ONDANSETRON HCL 4 MG/2ML IJ SOLN: 4.0000 mg | Freq: Four times a day (QID) | INTRAMUSCULAR | Status: DC | PRN

## 2020-03-06 MED ORDER — WARFARIN - PHARMACIST DOSING INPATIENT: Freq: Every day | Status: DC

## 2020-03-06 MED ORDER — FUROSEMIDE 20 MG PO TABS: 40.0000 mg | ORAL_TABLET | Freq: Two times a day (BID) | ORAL | Status: DC

## 2020-03-06 MED ORDER — SACUBITRIL-VALSARTAN 24-26 MG PO TABS
1.0000 | ORAL_TABLET | Freq: Two times a day (BID) | ORAL | Status: DC
  Administered 2020-03-07: 1 via ORAL
  Filled 2020-03-06 (×2): qty 1

## 2020-03-06 MED ORDER — ASPIRIN 81 MG PO CHEW: 81.0000 mg | CHEWABLE_TABLET | Freq: Every day | ORAL | Status: DC

## 2020-03-06 MED ORDER — ATORVASTATIN CALCIUM 10 MG PO TABS: 20.0000 mg | ORAL_TABLET | Freq: Every day | ORAL | Status: DC

## 2020-03-06 MED ORDER — METOPROLOL SUCCINATE ER 25 MG PO TB24: 12.5000 mg | ORAL_TABLET | Freq: Every day | ORAL | Status: DC

## 2020-03-06 MED ORDER — WARFARIN SODIUM 1 MG PO TABS
1.0000 mg | ORAL_TABLET | Freq: Once | ORAL | Status: AC
  Administered 2020-03-06: 1 mg via ORAL
  Filled 2020-03-06: qty 1

## 2020-03-06 MED ORDER — POLYETHYLENE GLYCOL 3350 17 G PO PACK: 17.0000 g | PACK | Freq: Every day | ORAL | Status: DC | PRN

## 2020-03-06 MED ORDER — DOCUSATE SODIUM 100 MG PO CAPS: 100.0000 mg | ORAL_CAPSULE | Freq: Two times a day (BID) | ORAL | Status: DC | PRN

## 2020-03-06 MED ORDER — IOHEXOL 300 MG/ML  SOLN
100.0000 mL | Freq: Once | INTRAMUSCULAR | Status: AC | PRN
  Administered 2020-03-06: 100 mL via INTRAVENOUS

## 2020-03-06 NOTE — Consult Note (Signed)
Reason for Consult: Splenic laceration Referring Physician: Vanita Panda MD  Anthony Everett is an 74 y.o. male.  HPI: Patient presents emergency room with a 1 day history of left-sided abdominal pain.  He has a history of a left ventricular assistive device placed [employer] 2 years ago.  He complains of left-sided abdominal pain.  No nausea or vomiting.  No history of trauma.  CT scans obtained which shows a grade 3 splenic laceration with some evidence of hemorrhage.  There is no active extravasation on CT scan.  He is hemodynamically stable.  He speaks only Spanish and his son is at the bedside today to help translate.  He has no history of fall.  He has no history of recent cough, vomiting or sneezing.  No past medical history on file.  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  Social History:  has no history on file for tobacco use, alcohol use, and drug use.  Allergies: Not on File  Medications: I have reviewed the patient's current medications.  Results for orders placed or performed during the hospital encounter of 03/06/20 (from the past 48 hour(s))  Comprehensive metabolic panel     Status: Abnormal   Collection Time: 03/06/20  5:13 PM  Result Value Ref Range   Sodium 139 135 - 145 mmol/L   Potassium 3.5 3.5 - 5.1 mmol/L   Chloride 103 98 - 111 mmol/L   CO2 26 22 - 32 mmol/L   Glucose, Bld 147 (H) 70 - 99 mg/dL    Comment: Glucose reference range applies only to samples taken after fasting for at least 8 hours.   BUN 13 8 - 23 mg/dL   Creatinine, Ser 1.02 0.61 - 1.24 mg/dL   Calcium 8.0 (L) 8.9 - 10.3 mg/dL   Total Protein 5.8 (L) 6.5 - 8.1 g/dL   Albumin 2.2 (L) 3.5 - 5.0 g/dL   AST 36 15 - 41 U/L   ALT 40 0 - 44 U/L   Alkaline Phosphatase 244 (H) 38 - 126 U/L   Total Bilirubin 1.5 (H) 0.3 - 1.2 mg/dL   GFR calc non Af Amer >60 >60 mL/min   GFR calc Af Amer >60 >60 mL/min   Anion gap 10 5 - 15     Comment: Performed at [facility], [place] [address], [place], [place] [address]  CBC with Differential     Status: Abnormal   Collection Time: 03/06/20  5:13 PM  Result Value Ref Range   WBC 11.6 (H) 4.0 - 10.5 K/uL   RBC 3.33 (L) 4.22 - 5.81 MIL/uL   Hemoglobin 8.3 (L) 13.0 - 17.0 g/dL   HCT 27.3 (L) 39 - 52 %   MCV 82.0 80.0 - 100.0 fL   MCH 24.9 (L) 26.0 - 34.0 pg   MCHC 30.4 30.0 - 36.0 g/dL   RDW 16.6 (H) 11.5 - 15.5 %   Platelets 155 150 - 400 K/uL   nRBC 0.0 0.0 - 0.2 %   Neutrophils Relative % 90 %   Neutro Abs 10.5 (H) 1.7 - 7.7 K/uL   Lymphocytes Relative 4 %   Lymphs Abs 0.4 (L) 0.7 - 4.0 K/uL   Monocytes Relative 4 %   Monocytes Absolute 0.4 0 - 1 K/uL   Eosinophils Relative 1 %   Eosinophils Absolute 0.1 0 - 0 K/uL   Basophils Relative 0 %   Basophils Absolute 0.0 0 - 0 K/uL  Immature Granulocytes 1 %   Abs Immature Granulocytes 0.09 (H) 0.00 - 0.07 K/uL    Comment: Performed at [facility], [place] [address] [place], [place] [address]  Protime-INR     Status: Abnormal   Collection Time: 03/06/20  5:13 PM  Result Value Ref Range   Prothrombin Time 21.2 (H) 11.4 - 15.2 seconds   INR 1.9 (H) 0.8 - 1.2    Comment: (NOTE) INR goal varies based on device and disease states. Performed at [facility], [place] [address], [place], [place] [address]   Lactate dehydrogenase     Status: None   Collection Time: 03/06/20  5:13 PM  Result Value Ref Range   LDH 187 98 - 192 U/L    Comment: Performed at [facility], [place] [address], [place], [place] [address]    CT ABDOMEN PELVIS W CONTRAST  Result Date: 03/06/2020 CLINICAL DATA:  Abdominal abscess/infection suspected Diverticulitis suspected Abdominal pain. EXAM: CT ABDOMEN AND PELVIS WITH CONTRAST TECHNIQUE: Multidetector CT imaging of the abdomen and pelvis was performed using the standard protocol following bolus administration of intravenous contrast. CONTRAST:  129mL OMNIPAQUE IOHEXOL 300 MG/ML   SOLN COMPARISON:  CT angiography 02/25/2016 FINDINGS: Lower chest: Multi chamber cardiomegaly. Left ventricular assist device in place. Bilateral lower lobe atelectasis with trace left pleural effusion. Hepatobiliary: Streak artifact from overlying monitoring devices. Trace high-density perihepatic fluid which tracks into the pericolic gutter. No focal hepatic lesion or evidence of injury. Gallbladder is partially distended. Equivocal wall hyperemia. No calcified gallstone. No biliary dilatation. Pancreas: Fatty atrophy. There is a 14 mm cyst in the proximal pancreatic body but is not significantly changed from prior exam. No acute peripancreatic inflammation. No ductal dilatation. Spleen: There is a linear density in the upper aspect of the spleen that is suspicious for splenic laceration. Spans at least 4 cm and is partially obscured by streak artifact from left ventricular assist device. Globular low-density in the inferior aspect of the spleen may represent intra splenic hematoma. There is a perisplenic hematoma with small to moderate amount of blood anterior inferiorly and tracking into the left pericolic gutter. No evidence of active extravasation. Spleen is overall normal in size. Adrenals/Urinary Tract: Normal adrenal glands. No hydronephrosis or perinephric edema. Tiny hypodensity in the left kidney is too small to accurately characterize. Symmetric excretion on delayed phase imaging. Urinary bladder is minimally distended. Stomach/Bowel: Small hiatal hernia. Ingested material distends the stomach. Small bowel is unremarkable without obstruction or inflammatory change. The appendix is normal coursing into the right upper quadrant. Scattered colonic diverticulosis without diverticulitis. Small to moderate colonic stool burden. No colonic inflammation or wall thickening. Vascular/Lymphatic: Aortic atherosclerosis. No aortic aneurysm. Patent portal and splenic veins. Small retroperitoneal lymph nodes not  enlarged by size criteria. Reproductive: Enlarged prostate gland spans 5.7 cm transverse. Other: Perisplenic hematoma with blood tracking in the left pericolic gutter. There is a small amount of blood in the right pericolic gutter which may be related to left upper quadrant process. Small amount of blood tracks into the pelvis. There is no free air. Fat within both inguinal canals. No focal abscess. Musculoskeletal: There are no acute or suspicious osseous abnormalities. Particularly, no evidence of left rib fracture. IMPRESSION: 1. Findings suspicious for splenic laceration superiorly, with intraparenchymal hematoma inferiorly and perisplenic hemorrhage. No evidence of active extravasation. If using the AAST injury scale, this is consistent with grade 3 injury. Small amount of blood tracks in the left pericolic gutter.  Small amount of blood in the right pericolic gutter and pelvis is likely related to left upper quadrant process. 2. Cardiomegaly with left ventricular assist device in place. Bilateral lower lobe atelectasis with trace left pleural effusion. 3. Colonic diverticulosis without diverticulitis. 4. Enlarged prostate gland. 5. 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 Aortic Atherosclerosis (ICD10-I70.0). Critical Value/emergent results were called by telephone at the time of interpretation on 03/06/2020 at 7:54 pm to Dr Carmin Muskrat , who verbally acknowledged these results. Electronically Signed   By: Keith Rake M.D.   On: 03/06/2020 19:55   DG Chest Port 1 View  Result Date: 03/06/2020 CLINICAL DATA:  74 year old male with history of leukocytosis. Fatigue and low oxygen saturations for the past 2 days. EXAM: PORTABLE CHEST 1 VIEW COMPARISON:  Chest x-ray 06/12/2015. FINDINGS: Lung volumes are slightly low. No definite consolidative airspace disease. No pleural effusions. No pneumothorax. No evidence of pulmonary edema. Left ventricular assist device projecting over the apex of the  left ventricle. Moderate cardiomegaly. Upper mediastinal contours are within normal limits. Status post cholecystectomy. IMPRESSION: 1. Postoperative changes and support apparatus, as above. 2. No radiographic evidence of acute cardiopulmonary disease. 3. Cardiomegaly. Electronically Signed   By: Vinnie Langton M.D.   On: 03/06/2020 19:48    Review of Systems  Gastrointestinal: Positive for abdominal pain.  All other systems reviewed and are negative.  Pulse 72, temperature (!) 97.4 F (36.3 C), temperature source Tympanic, resp. rate 13, SpO2 95 %. Physical Exam Constitutional:      Appearance: He is well-developed.  HENT:     Head: Normocephalic and atraumatic.  Eyes:     Extraocular Movements: Extraocular movements intact.     Pupils: Pupils are equal, round, and reactive to light.  Cardiovascular:     Comments: Irregular rate and rhythm. Pulmonary:     Effort: Pulmonary effort is normal.     Breath sounds: Normal breath sounds. No stridor.     Comments: Assist device noted Abdominal:     General: Abdomen is flat. There is no distension.     Palpations: Abdomen is soft.     Tenderness: There is abdominal tenderness in the left upper quadrant.     Hernia: No hernia is present.  Skin:    General: Skin is warm and dry.  Neurological:     General: No focal deficit present.     Mental Status: He is alert.  Psychiatric:        Mood and Affect: Mood normal.        Behavior: Behavior normal.     Assessment/Plan: Grade 3 splenic laceration-he is hemodynamically stable.  Given his multiple medical problems nonoperative management recommended.  If he has a drop in hemoglobin that is significant or has any instability, recommend IR for embolization of the spleen given his multiple medical problems.  Would follow serial H&H and try to keep INR less than 2.5  if able which may be difficult given device.  Small amount of hemoperitoneum noted.  Surgery will follow along.  Transfuse as  needed.  Avoid over anticoagulation especially in the next 24 to 48 hours.  Anthony Everett A Gibson Lad 03/06/2020, 9:01 PM

## 2020-03-06 NOTE — Progress Notes (Addendum)
LVAD Coordinator ED Encounter  Anthony Everett 74 yo male that presented to [facility] ER today due to abdominal pain. He has a past medical history  has no past medical history on file.  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   Per ER RN patient was seen at [facility] urgent care for abdominal pain. Urgent care called EMS for abdominal pain and hypotension. Patient is Spanish speaking only. Per ER RN patient has black backup bag at bedside.   Requested CBC w/diff, CMET, INR, LDH, and Lactic acid. Additional labs, and testing, per ER provider.   Provided ER RN Annie Main with [facility] pager [phone] and requested they contact [facility] if found that patient needs to be admitted. He verbalized understanding and said he would pass on information to ER provider.   Vital signs: HR: 74  Doppler MAP: 98 Automated BP: 94/73 (79) O2 Sat: 97 % on RA  LVAD interrogation reveals:  Speed: 5400 Flow: 4.3 Power: 3.9 w  PI: 2.8  Alarms: ER RN performed interrogation of device over the phone. No alarms noted Events: 10-20 PI events noted daily  Drive Line:   Updated VAD Providers Dr Aundra Dubin about the above. No LVAD issues and pump is functioning as expected. Able to independently manage LVAD equipment. No LVAD needs at this time.    Emerson Monte RN [place] Coordinator  Office: [phone]  24/7 Pager: [phone]

## 2020-03-06 NOTE — ED Provider Notes (Signed)
[place] [facility] EMERGENCY DEPARTMENT Provider Note   CSN: [id number] Arrival date & time: 03/06/20  1705     History Chief Complaint  Patient presents with  . Abdominal Pain    Anthony Everett is a 74 y.o. male.  HPI    Patient presents with his son who assists with the history, though additional details are obtained via translator device. The patient now presents with abdominal pain. Notably, patient has a history of LVAD, inserted at [facility] However, patient has no chest pain, no dyspnea, no fever. Patient now presents with abdominal pain that began about 5 days ago with nausea, vomiting. Family members had less severe GI illness about the same time, but no one has had persistent symptoms similar to his. He notes that over the past few days he has had pain throughout his abdomen, primarily upper, sore, moderate.  It is unclear if he is taking any medication for relief, though symptoms are persistent, worsening.  Past medical and surgical: LVAD, A. Fib, heart failure Past social: Lives with family, from [place] originally, no smoker  Home Medications Prior to Admission medications   Not on File    Allergies    Patient has no allergy information on record.  Review of Systems   Review of Systems  Constitutional:       Per HPI, otherwise negative  HENT:       Per HPI, otherwise negative  Respiratory:       Per HPI, otherwise negative  Cardiovascular:       Per HPI, otherwise negative  Gastrointestinal: Positive for abdominal pain, nausea and vomiting.  Endocrine:       Negative aside from HPI  Genitourinary:       Neg aside from HPI   Musculoskeletal:       Per HPI, otherwise negative  Skin: Negative.   Neurological: Negative for syncope.  Hematological: Bruises/bleeds easily.    Physical Exam Updated Vital Signs Pulse 82   Temp (!) 97.4 F (36.3 C) (Tympanic)   Resp 20   SpO2 99%   Physical Exam Vitals and nursing note  reviewed.  Constitutional:      General: He is not in acute distress.    Appearance: He is well-developed.  HENT:     Head: Normocephalic and atraumatic.  Eyes:     Conjunctiva/sclera: Conjunctivae normal.  Cardiovascular:     Rate and Rhythm: Normal rate and regular rhythm.     Comments: Palpable pulse.  Mechanical whirr also appreciable at upper chest Pulmonary:     Effort: Pulmonary effort is normal. No respiratory distress.     Breath sounds: No stridor.  Chest:    Abdominal:     Tenderness: There is generalized abdominal tenderness.  Skin:    General: Skin is warm and dry.  Neurological:     Mental Status: He is alert and oriented to person, place, and time.      ED Results / Procedures / Treatments   Labs (all labs ordered are listed, but only abnormal results are displayed) Labs Reviewed  COMPREHENSIVE METABOLIC PANEL - Abnormal; Notable for the following components:      Result Value   Glucose, Bld 147 (*)    Calcium 8.0 (*)    Total Protein 5.8 (*)    Albumin 2.2 (*)    Alkaline Phosphatase 244 (*)    Total Bilirubin 1.5 (*)    All other components within normal limits  CBC WITH DIFFERENTIAL/PLATELET - Abnormal;  Notable for the following components:   WBC 11.6 (*)    RBC 3.33 (*)    Hemoglobin 8.3 (*)    HCT 27.3 (*)    MCH 24.9 (*)    RDW 16.6 (*)    Neutro Abs 10.5 (*)    Lymphs Abs 0.4 (*)    Abs Immature Granulocytes 0.09 (*)    All other components within normal limits  PROTIME-INR - Abnormal; Notable for the following components:   Prothrombin Time 21.2 (*)    INR 1.9 (*)    All other components within normal limits  LACTATE DEHYDROGENASE    EKG EKG Interpretation  Date/Time:  Wednesday March 06 2020 17:10:47 EDT Ventricular Rate:  82 PR Interval:    QRS Duration: 110 QT Interval:    QTC Calculation:   R Axis:   -40 Text Interpretation: Atrial fibrillation LVAD Artifact Abnormal ECG Confirmed by Carmin Muskrat [phone]) on 03/06/2020  5:12:43 PM   Radiology CT ABDOMEN PELVIS W CONTRAST  Result Date: 03/06/2020 CLINICAL DATA:  Abdominal abscess/infection suspected Diverticulitis suspected Abdominal pain. EXAM: CT ABDOMEN AND PELVIS WITH CONTRAST TECHNIQUE: Multidetector CT imaging of the abdomen and pelvis was performed using the standard protocol following bolus administration of intravenous contrast. CONTRAST:  155mL OMNIPAQUE IOHEXOL 300 MG/ML  SOLN COMPARISON:  CT angiography 02/25/2016 FINDINGS: Lower chest: Multi chamber cardiomegaly. Left ventricular assist device in place. Bilateral lower lobe atelectasis with trace left pleural effusion. Hepatobiliary: Streak artifact from overlying monitoring devices. Trace high-density perihepatic fluid which tracks into the pericolic gutter. No focal hepatic lesion or evidence of injury. Gallbladder is partially distended. Equivocal wall hyperemia. No calcified gallstone. No biliary dilatation. Pancreas: Fatty atrophy. There is a 14 mm cyst in the proximal pancreatic body but is not significantly changed from prior exam. No acute peripancreatic inflammation. No ductal dilatation. Spleen: There is a linear density in the upper aspect of the spleen that is suspicious for splenic laceration. Spans at least 4 cm and is partially obscured by streak artifact from left ventricular assist device. Globular low-density in the inferior aspect of the spleen may represent intra splenic hematoma. There is a perisplenic hematoma with small to moderate amount of blood anterior inferiorly and tracking into the left pericolic gutter. No evidence of active extravasation. Spleen is overall normal in size. Adrenals/Urinary Tract: Normal adrenal glands. No hydronephrosis or perinephric edema. Tiny hypodensity in the left kidney is too small to accurately characterize. Symmetric excretion on delayed phase imaging. Urinary bladder is minimally distended. Stomach/Bowel: Small hiatal hernia. Ingested material distends the  stomach. Small bowel is unremarkable without obstruction or inflammatory change. The appendix is normal coursing into the right upper quadrant. Scattered colonic diverticulosis without diverticulitis. Small to moderate colonic stool burden. No colonic inflammation or wall thickening. Vascular/Lymphatic: Aortic atherosclerosis. No aortic aneurysm. Patent portal and splenic veins. Small retroperitoneal lymph nodes not enlarged by size criteria. Reproductive: Enlarged prostate gland spans 5.7 cm transverse. Other: Perisplenic hematoma with blood tracking in the left pericolic gutter. There is a small amount of blood in the right pericolic gutter which may be related to left upper quadrant process. Small amount of blood tracks into the pelvis. There is no free air. Fat within both inguinal canals. No focal abscess. Musculoskeletal: There are no acute or suspicious osseous abnormalities. Particularly, no evidence of left rib fracture. IMPRESSION: 1. Findings suspicious for splenic laceration superiorly, with intraparenchymal hematoma inferiorly and perisplenic hemorrhage. No evidence of active extravasation. If using the AAST injury scale, this  is consistent with grade 3 injury. Small amount of blood tracks in the left pericolic gutter. Small amount of blood in the right pericolic gutter and pelvis is likely related to left upper quadrant process. 2. Cardiomegaly with left ventricular assist device in place. Bilateral lower lobe atelectasis with trace left pleural effusion. 3. Colonic diverticulosis without diverticulitis. 4. Enlarged prostate gland. 5. 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 Aortic Atherosclerosis (ICD10-I70.0). Critical Value/emergent results were called by telephone at the time of interpretation on 03/06/2020 at 7:54 pm to Dr Carmin Muskrat , who verbally acknowledged these results. Electronically Signed   By: Keith Rake M.D.   On: 03/06/2020 19:55   DG Chest Port 1 View  Result  Date: 03/06/2020 CLINICAL DATA:  74 year old male with history of leukocytosis. Fatigue and low oxygen saturations for the past 2 days. EXAM: PORTABLE CHEST 1 VIEW COMPARISON:  Chest x-ray 06/12/2015. FINDINGS: Lung volumes are slightly low. No definite consolidative airspace disease. No pleural effusions. No pneumothorax. No evidence of pulmonary edema. Left ventricular assist device projecting over the apex of the left ventricle. Moderate cardiomegaly. Upper mediastinal contours are within normal limits. Status post cholecystectomy. IMPRESSION: 1. Postoperative changes and support apparatus, as above. 2. No radiographic evidence of acute cardiopulmonary disease. 3. Cardiomegaly. Electronically Signed   By: Vinnie Langton M.D.   On: 03/06/2020 19:48    Procedures Procedures (including critical care time)  Medications Ordered in ED Medications  iohexol (OMNIPAQUE) 300 MG/ML solution 100 mL (100 mLs Intravenous Contrast Given 03/06/20 1925)   MDM Number of Diagnoses or Management Options Generalized abdominal pain: new, needed workup Hemoperitoneum: new, needed workup Laceration of spleen, initial encounter: new, needed workup   Amount and/or Complexity of Data Reviewed Clinical lab tests: reviewed Tests in the radiology section of CPT: reviewed Tests in the medicine section of CPT: reviewed Discussion of test results with the performing providers: yes Decide to obtain previous medical records or to obtain history from someone other than the patient: yes Obtain history from someone other than the patient: yes Review and summarize past medical records: yes Discuss the patient with other providers: yes Independent visualization of images, tracings, or specimens: yes  Risk of Complications, Morbidity, and/or Mortality Presenting problems: high Diagnostic procedures: high Management options: high  Critical Care Total time providing critical care: 30-74 minutes  Patient  Progress Patient progress: stable   ED Course  I have reviewed the triage vital signs and the nursing notes.  Pertinent labs & imaging results that were available during my care of the patient were reviewed by me and considered in my medical decision making (see chart for details).  Patient with left ventricular assist device presents with abdominal pain, nausea, vomiting pulmonary initial differential including infection, device malfunction, ACS, intra-abdominal pathology, all considered, x-ray, CT, labs, continuous monitoring ordered. Patient's case discussed with LVAD coordinator, patient was monitored via telemetry, had no notable LVAD dysfunction  Update:, Patient's LVAD unremarkable, initial labs notable for mild leukocytosis.  Update:, I discussed the patient's CT findings with the radiologist.  CT abnormalities include splenic laceration, grade 3, intracapsular and extracapsular blood. On repeat exam the patient is awake and alert, no verbal finding, as is his son. I discussed these with our surgeon on-call, and with our critical care colleagues.  This adult male with left ventricular assist device now presents with abdominal pain, nausea, vomiting, is found to have splenic lack with intraperitoneal blood as well. Given the patient's ongoing necessity for anticoagulation,  he required admission to the ICU, for further consideration of embolization versus surgery versus medical management.  Final Clinical Impression(s) / ED Diagnoses Final diagnoses:  Generalized abdominal pain  Hemoperitoneum  Laceration of spleen, initial encounter     Carmin Muskrat, MD 03/06/20 2114

## 2020-03-06 NOTE — ED Triage Notes (Signed)
Pt [facility] w/ c/o abdominal pain. Pt has LVAD.

## 2020-03-06 NOTE — ED Notes (Signed)
Secure message sent to pharmacy requesting entresto.

## 2020-03-06 NOTE — Progress Notes (Signed)
ANTICOAGULATION CONSULT NOTE -  Pharmacy Consult for Warfarin  Indication: LVAD   No Known Allergies  Patient Measurements:   Heparin Dosing Weight:   Vital Signs: Temp: 97.4 F (36.3 C) (07/14 1712) Temp Source: Tympanic (07/14 1712) Pulse Rate: 72 (07/14 1930)  Labs: Recent Labs    03/06/20 1713  HGB 8.3*  HCT 27.3*  PLT 155  LABPROT 21.2*  INR 1.9*  CREATININE 1.02    CrCl cannot be calculated (Unknown ideal weight.).   Medical History: No past medical history on file.  Medications:  Scheduled:  . [START ON 03/07/2020] amiodarone  200 mg Oral Daily  . [START ON 03/07/2020] aspirin  81 mg Oral Daily  . [START ON 03/07/2020] atorvastatin  20 mg Oral Daily  . [START ON 03/07/2020] furosemide  40 mg Oral BID  . [START ON 03/07/2020] metoprolol succinate  12.5 mg Oral Daily  . sacubitril-valsartan  1 tablet Oral BID  . [START ON 03/07/2020] Warfarin - Pharmacist Dosing Inpatient   Does not apply q1600    Assessment: Patient is a 48 yom that presents to the ED with c/o abd pain. The patient was found to have a Grade III liver lac with signs of hemorrhage. With the patient being placed on an LVAD and requiring anticoagulation Pharmacy has been asked to dose heparin at this time. Will attempt to keep INR < 2.5 and more toward a goal of 2.0.   Goal of Therapy:  INR 2-2.5 Monitor platelets by anticoagulation protocol: Yes   Plan:  - With patient having a Grade II liver lac and attempting to keep the patient's INR toward the lower range will reduce the Warfarin dose this evening.  - Give Warfarin 1mg  PO x 1 dose tonight  - PT-INR daily and monitor patient for s/s of bleeding - Minimal daily cbc (admitting has currently ordered every 6 hours)  Duanne Limerick PharmD. BCPS  03/06/2020,10:41 PM

## 2020-03-06 NOTE — H&P (Signed)
NAME:  Anthony Everett, MRN:  [id number], DOB:  06/10/1946, LOS: 0 ADMISSION DATE:  03/06/2020, CONSULTATION DATE: 03/06/2020 REFERRING MD:  Dr. Vanita Panda, CHIEF COMPLAINT: Grade 3 splenic laceration  Brief History   74 year old male presented with complaint of abdominal pain work-up on admission revealed grade 3 splenic laceration.  Pertinent history includes LVAD placement approximately 2 years ago on chronic anticoagulation with Coumadin.  Given high risk for bleeding PCCM consulted for ICU admission  History of present illness   Anthony Everett is a very pleasant 73 year old male who presented with complaints of abdominal pain.  History obtained with assistance from patient's grandson given night language barrier.  Per family and patient approximately 5 days ago patient reports having reaction to "bad meat" at which time due to nausea patient attempted to vomit on multiple occasions but was unable to inject any vomitus.  Patient was never able to actively vomit but retched on multiple occasions.  Patient states nausea is significantly improved but when abdominal pain denied less than this prompted admission to ED.  Of note patient has LVAD in place that was inserted at [facility] approximately 2 years ago. Family and patient report no other acute symptoms including no fever, chills, headache, chest pain, diarrhea, or lower extremity edema.  Vital signs on admission relatively unremarkable.  Lab work on admission significant for mild hyperglycemia, elevated alkaline phosphatase of 244, total protein low at 5.8, total albumin 1.5, WBC 11.6, hemoglobin 8.3, hematocrit 27.3, PTT 21.2, INR 1.9.  CT abdomen pelvis on admission revealed grade 3 splenic laceration with intraparenchymal hematoma inferiorly and perisplenic hemorrhage no active evidence of extravasation.  Given chronic anticoagulation in the presence of an LVAD and acute splenic laceration PCCM consulted for admission and  close observation.   Past Medical History  Nonischemic cardiomyopathy Congestive heart failure LVAD, HM III Paroxysmal atrial fibrillation Hypertension History of ventricular tachycardia Chronic anticoagulation  Significant Hospital Events   Admitted 7/14  Consults:  General surgery LVAD coordinator  Procedures:  None  Significant Diagnostic Tests:  CT abdomen and pelvis 7/14 > 1. Findings suspicious for splenic laceration superiorly, with intraparenchymal hematoma inferiorly and perisplenic hemorrhage. No evidence of active extravasation. If using the AAST injury scale, this is consistent with grade 3 injury. Small amount of blood tracks in the left pericolic gutter. Small amount of blood in the right pericolic gutter and pelvis is likely related to left upper quadrant process. 2. Cardiomegaly with left ventricular assist device in place. Bilateral lower lobe atelectasis with trace left pleural effusion. 3. Colonic diverticulosis without diverticulitis. 4. Enlarged prostate gland. 5. 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  Micro Data:  None  Antimicrobials:  None  Interim history/subjective:  Sitting up in bed in with reported improvements in abdominal pain.  Grandson at bedside and utilized as Optometrist.  Patient is requesting something to eat and drink.  Objective   Pulse 72, temperature (!) 97.4 F (36.3 C), temperature source Tympanic, resp. rate 13, SpO2 95 %.       No intake or output data in the 24 hours ending 03/06/20 2207 There were no vitals filed for this visit.  Examination: General: Very pleasant [name] sitting up in bed in no acute distress HEENT: [place]/AT, MM pink/moist, PERRL, sclera nonicteric Neuro: Alert and oriented x3, nonfocal CV: s1s2 regular rate and rhythm, no murmur, rubs, or gallops,  PULM: Clear to auscultation bilaterally GI: soft, bowel sounds active in all 4 quadrants, non-tender,  slightly distended Extremities: warm/dry, no edema  Skin: no rashes or lesions   Resolved Hospital Problem list     Assessment & Plan:  Grade 3 splenic laceration -Patient reported significant nausea with retching after possible food poisoning, denies any trauma to abdomen or fall. P: General surgery consulted no acute indications for surgical interventions at this time If signs of active bleeding develop may be candidate for IR embolization Per general surgery INR goal less than 2.5 if able Close monitoring in the ICU setting  Acute blood loss anemia -In the setting of splenic laceration, hemoglobin and recent [facility] visit 11.7 02/19/2020.  Hemoglobin on admission 8.3 Chronic anticoagulation -Patient is chronically anticoagulated with Coumadin in the setting of an LVAD P: Every 6hr CBC Type and screen Pharmacy consultation for dosing of Coumadin  Chronic systolic congestive heart failure -Followed by [facility], home medications include aspirin, Lipitor, Lasix, Toprol-XL, Entresto, and amiodarone Presence of LVAD, HM III -LVAD placed approximately 2 years ago, family denies any acute complications.  Driveline site dressing clean dry and intact P: LVAD team consulted for assistance in management Admit to CVICU Supportive care Home medications continue during admission  Paroxysmal atrial fibrillation -Home medication includes Toprol-XL and amiodarone per care everywhere P: Continue home beta-blocker and amiodarone Continuous telemetry Close monitoring in the ICU  CKD stage II Baseline creatinine appears to be 1.4, creatinine 1.02 on admission P: Follow renal function / urine output Trend Bmet Avoid nephrotoxins, ensure adequate renal perfusion    Best practice:  Diet: Heart healthy Pain/Anxiety/Delirium protocol (if indicated): As needed VAP protocol (if indicated): Not applicable DVT prophylaxis: Home Coumadin GI prophylaxis: PPI Glucose control:  SSI Mobility: Up with assistance Code Status: Partial code no CPR Family Communication: Family updated at bedside Disposition: ICU  Labs   CBC: Recent Labs  Lab 03/06/20 1713  WBC 11.6*  NEUTROABS 10.5*  HGB 8.3*  HCT 27.3*  MCV 82.0  PLT 951    Basic Metabolic Panel: Recent Labs  Lab 03/06/20 1713  NA 139  K 3.5  CL 103  CO2 26  GLUCOSE 147*  BUN 13  CREATININE 1.02  CALCIUM 8.0*   GFR: CrCl cannot be calculated (Unknown ideal weight.). Recent Labs  Lab 03/06/20 1713  WBC 11.6*    Liver Function Tests: Recent Labs  Lab 03/06/20 1713  AST 36  ALT 40  ALKPHOS 244*  BILITOT 1.5*  PROT 5.8*  ALBUMIN 2.2*   No results for input(s): LIPASE, AMYLASE in the last 168 hours. No results for input(s): AMMONIA in the last 168 hours.  ABG No results found for: PHART, PCO2ART, PO2ART, HCO3, TCO2, ACIDBASEDEF, O2SAT   Coagulation Profile: Recent Labs  Lab 03/06/20 1713  INR 1.9*    Cardiac Enzymes: No results for input(s): CKTOTAL, CKMB, CKMBINDEX, TROPONINI in the last 168 hours.  HbA1C: No results found for: HGBA1C  CBG: No results for input(s): GLUCAP in the last 168 hours.  Review of Systems: Positives in bold  Gen: Denies fever, chills, weight change, fatigue, night sweats HEENT: Denies blurred vision, double vision, hearing loss, tinnitus, sinus congestion, rhinorrhea, sore throat, neck stiffness, dysphagia PULM: Denies shortness of breath, cough, sputum production, hemoptysis, wheezing CV: Denies chest pain, edema, orthopnea, paroxysmal nocturnal dyspnea, palpitations GI: Denies abdominal pain, nausea, vomiting, diarrhea, hematochezia, melena, constipation, change in bowel habits GU: Denies dysuria, hematuria, polyuria, oliguria, urethral discharge Endocrine: Denies hot or cold intolerance, polyuria, polyphagia or appetite change Derm: Denies rash, dry skin, scaling or peeling  skin change Heme: Denies easy bruising, bleeding, bleeding  gums Neuro: Denies headache, numbness, weakness, slurred speech, loss of memory or consciousness  Past Medical History  He,  has no past medical history on file.   Surgical History      Social History      Family History   His family history is not on file.   Allergies Not on File   Home Medications  Prior to Admission medications   Not on File     Signature:   Johnsie Cancel, NP-C [facility] Contact / Pager information can be found on Amion  03/06/2020, 10:31 PM

## 2020-03-07 ENCOUNTER — Other Ambulatory Visit: Payer: Self-pay

## 2020-03-07 LAB — SARS CORONAVIRUS 2 BY RT PCR (HOSPITAL ORDER, PERFORMED IN ~~LOC~~ HOSPITAL LAB): SARS Coronavirus 2: NEGATIVE

## 2020-03-07 NOTE — ED Notes (Signed)
Pt transported to [facility].

## 2020-03-07 NOTE — Progress Notes (Signed)
PCCM Progress Note   Called and spoke to our LVAD coordinator in attempt to clarify INR goal in the setting of grad 3 splenic laceration and I was informed that the general protocol call in this situation is to transfer to [facility] for continuation of care. I clarified this with Dr. Aundra Dubin and he agreed that Anthony Everett would be better served at [facility] and recommended the patient be transferred from our ED to [facility] directly. This was relayed to the ED provider and Dr. Dayna Barker stated that the [facility] transfer line will be contacted and the transfer would be attempted.   I also contacted the LVAD coordinator at [facility] for clarification on INR goal and she stated to hold coumadin dose for tonight and they will reassess in the morning. Per Care Everywhere patients goal INR is 2-3 but it appears he has been subtherapeutic recently. Will monitor INR in the AM.   Johnsie Cancel, NP-C [facility] Contact / Pager information can be found on Amion  03/07/2020, 12:43 AM

## 2020-03-07 NOTE — ED Notes (Signed)
Attempted to get connected to RN at [facility] so RN Mel Almond could give report, nurse currently refusing to take report and informed to call back in an hour

## 2020-05-24 DEATH — deceased

## 2020-11-12 IMAGING — DX DG CHEST 1V PORT
1 series · 1 of 1 positions shown · non-contrast
Comparison: Chest x-ray 06/12/2015.

CLINICAL DATA: 73-year-old male with history of leukocytosis.
Fatigue and low oxygen saturations for the past 2 days.

EXAM:
PORTABLE CHEST 1 VIEW

[chest ap]
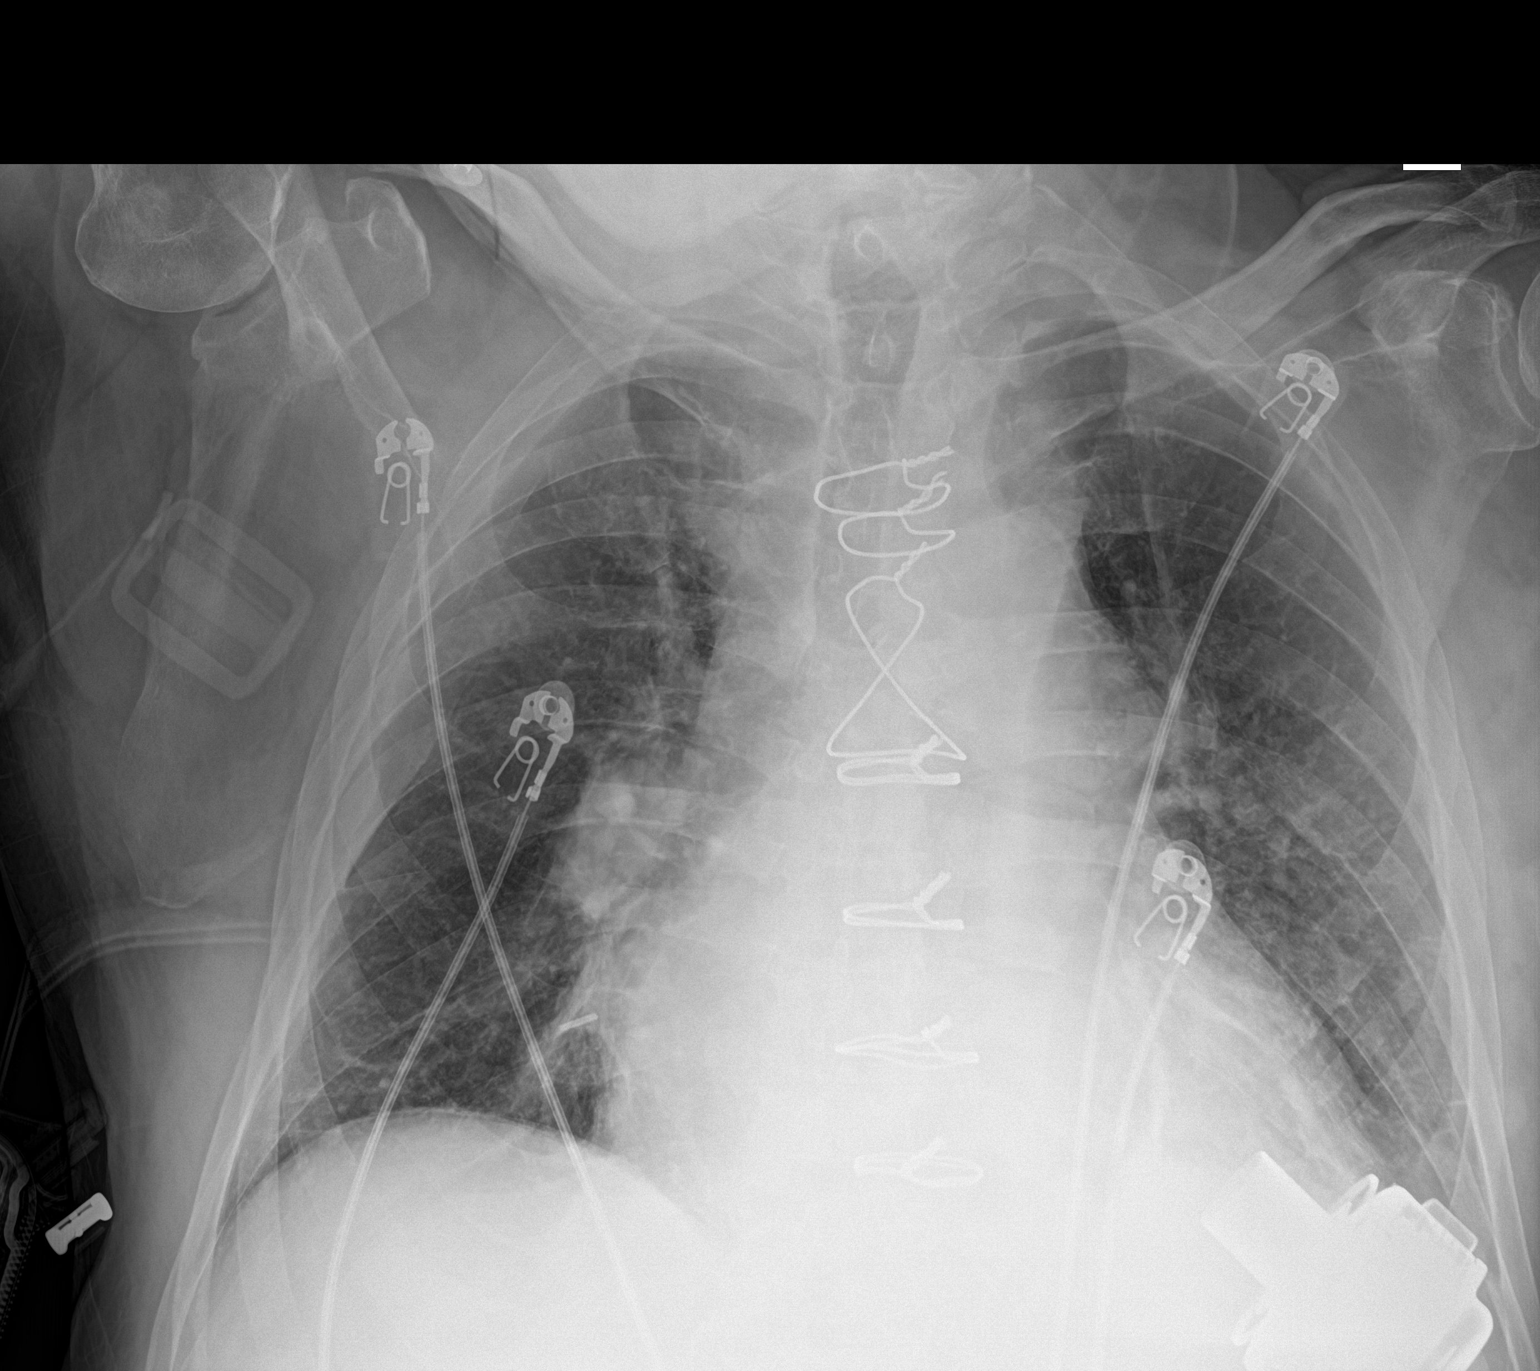

[1 of 1 positions shown; findings below may reference images not displayed]

FINDINGS: Lung volumes are slightly low. No definite consolidative airspace
disease. No pleural effusions. No pneumothorax. No evidence of
pulmonary edema. Left ventricular assist device projecting over the
apex of the left ventricle. Moderate cardiomegaly. Upper mediastinal
contours are within normal limits. Status post cholecystectomy.
IMPRESSION: 1. Postoperative changes and support apparatus, as above.
2. No radiographic evidence of acute cardiopulmonary disease.
3. Cardiomegaly.

## 2020-11-12 IMAGING — CT CT ABD-PELV W/ CM
2 of 5 series · 15 of 46 positions shown, 17 images · IV contrast (APPLIED)
Comparison: CT angiography 02/25/2016

CLINICAL DATA: Abdominal abscess/infection suspected Diverticulitis
suspected

Abdominal pain.
EXAM:
CT ABDOMEN AND PELVIS WITH CONTRAST
TECHNIQUE: Multidetector CT imaging of the abdomen and pelvis was performed
using the standard protocol following bolus administration of
intravenous contrast.
CONTRAST:  100mL OMNIPAQUE IOHEXOL 300 MG/ML  SOLN

[Series 3: abd/ pelvis 5.0 i30f 2 · axial · 0.84mm/px · z∈[+748,+1208]mm · 12 of 104 slices shown, 14 images]
[im 6/104  soft-tissue]
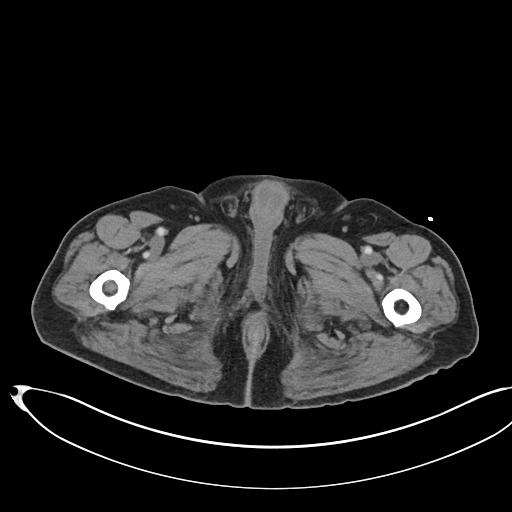
[im 6/104  bone]
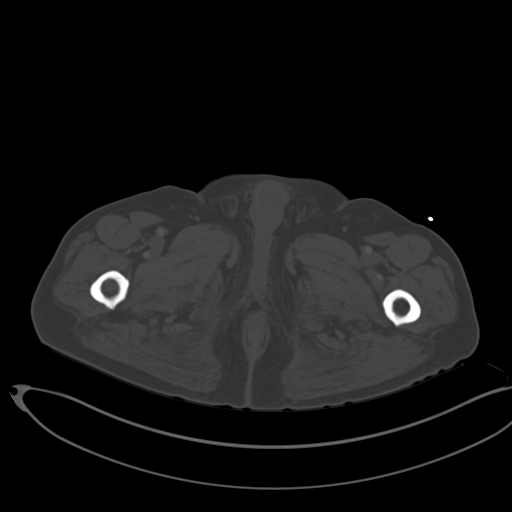
[im 17/104  soft-tissue]
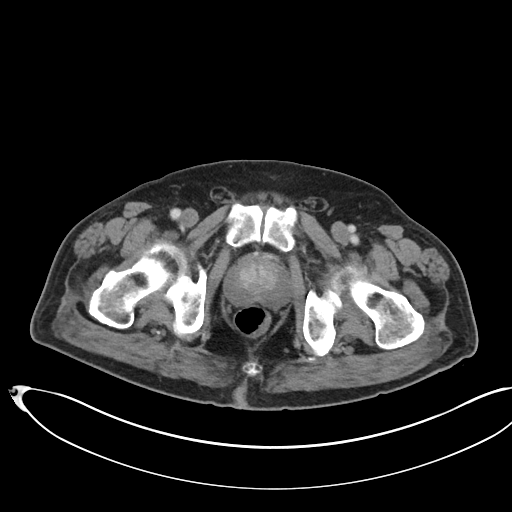
[im 22/104  soft-tissue]
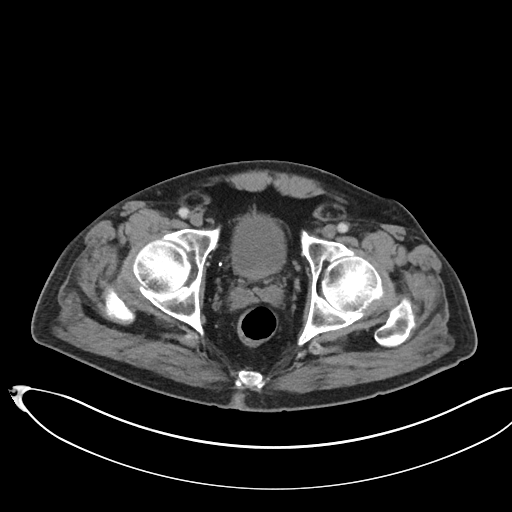
[im 33/104  soft-tissue]
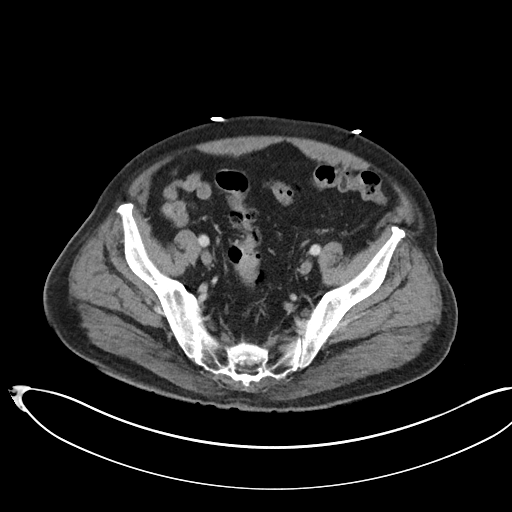
[im 38/104  soft-tissue]
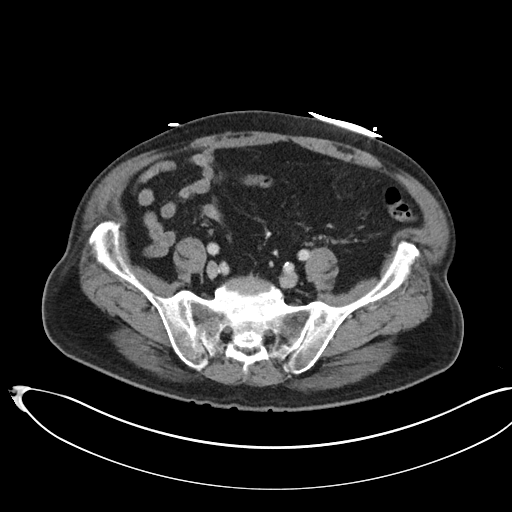
[im 49/104  soft-tissue]
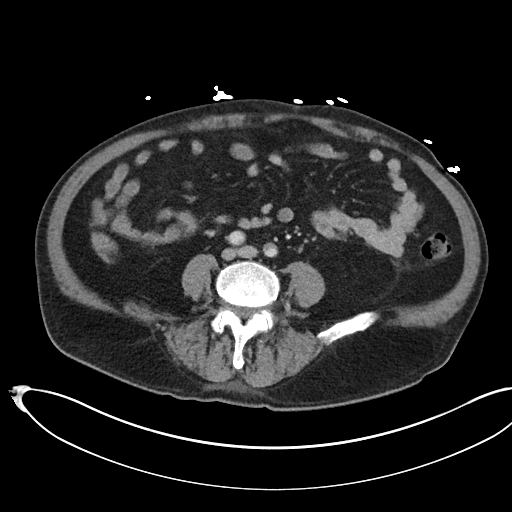
[im 55/104  soft-tissue]
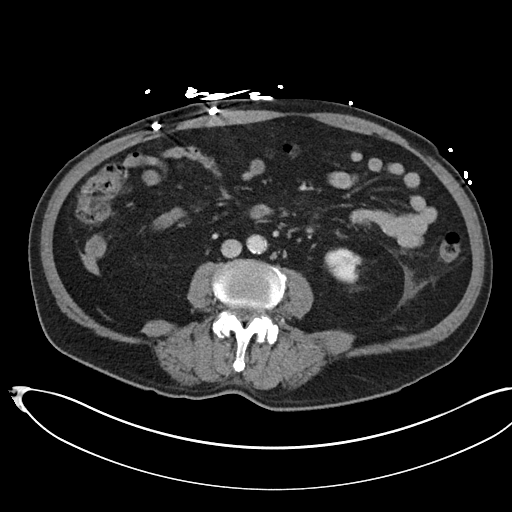
[im 66/104  soft-tissue]
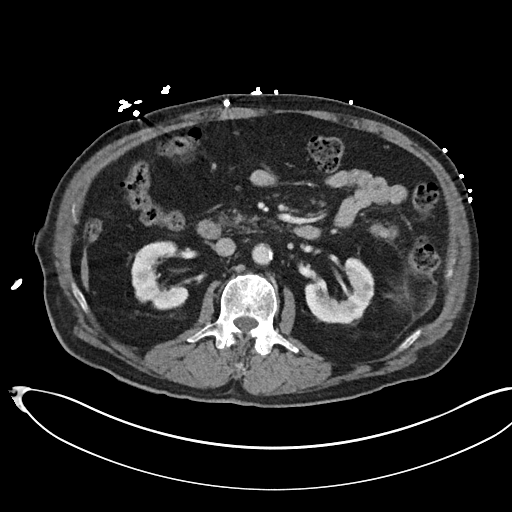
[im 71/104  soft-tissue]
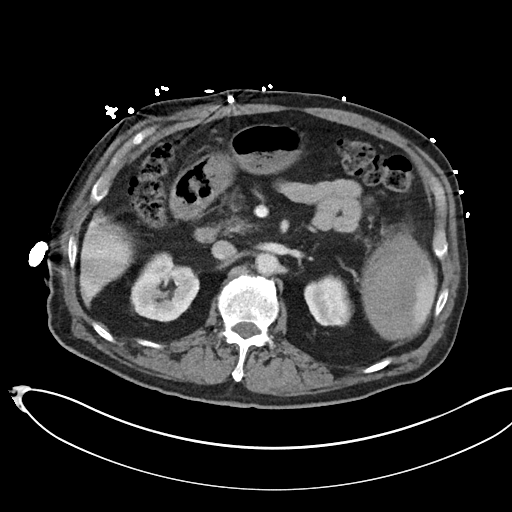
[im 71/104  bone]
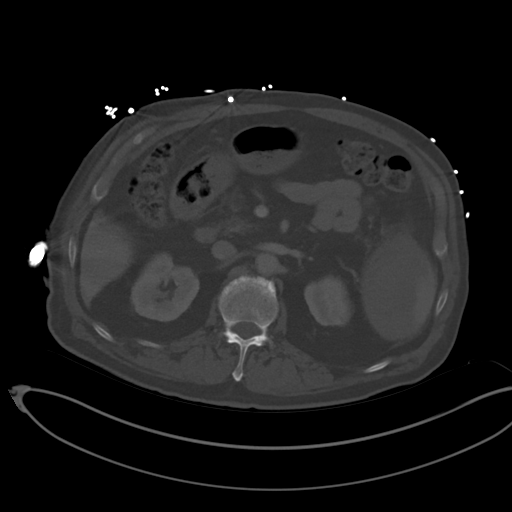
[im 82/104  soft-tissue]
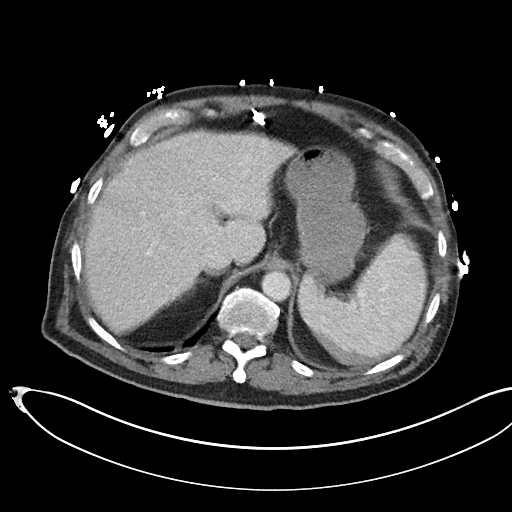
[im 87/104  soft-tissue]
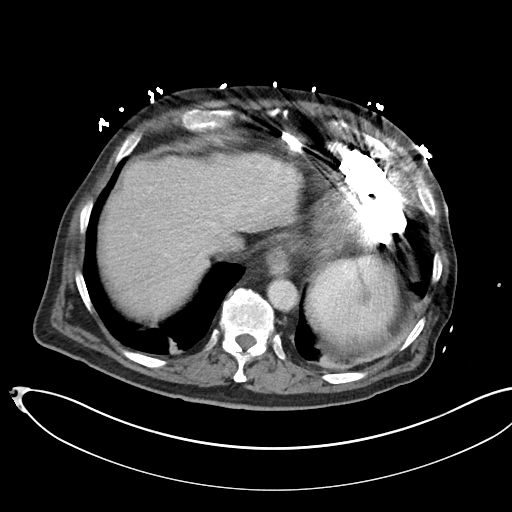
[im 98/104  soft-tissue]
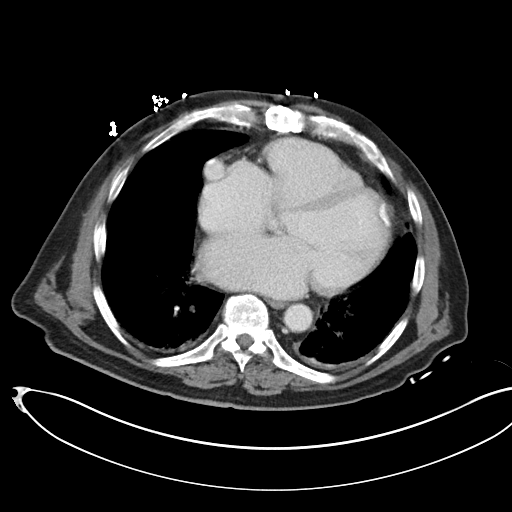

[Series 6: coronal soft tissue · coronal · 0.91mm/px · 3 of 101 slices shown]
[im 34/101  soft-tissue]
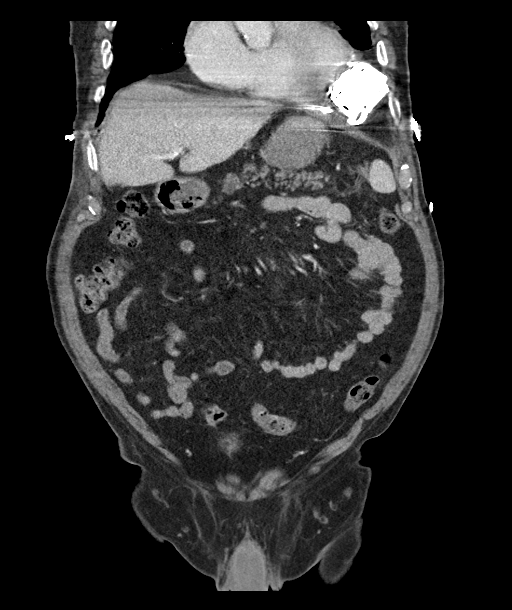
[im 45/101  soft-tissue]
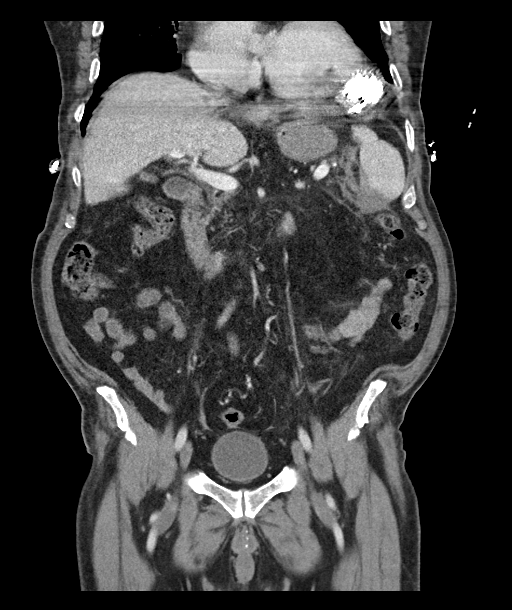
[im 56/101  soft-tissue]
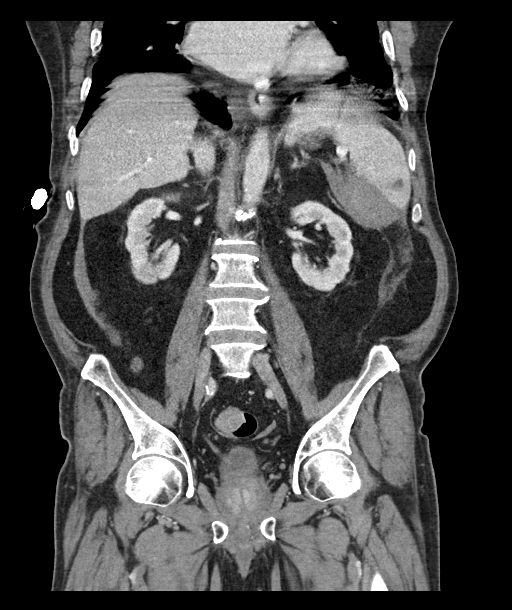

[15 of 46 positions shown; findings below may reference images not displayed]

FINDINGS: Lower chest: Multi chamber cardiomegaly. Left ventricular assist
device in place. Bilateral lower lobe atelectasis with trace left
pleural effusion.

Hepatobiliary: Streak artifact from overlying monitoring devices.
Trace high-density perihepatic fluid which tracks into the pericolic
gutter. No focal hepatic lesion or evidence of injury. Gallbladder
is partially distended. Equivocal wall hyperemia. No calcified
gallstone. No biliary dilatation.

Pancreas: Fatty atrophy. There is a 14 mm cyst in the proximal
pancreatic body but is not significantly changed from prior exam. No
acute peripancreatic inflammation. No ductal dilatation.

Spleen: There is a linear density in the upper aspect of the spleen
that is suspicious for splenic laceration. Spans at least 4 cm and
is partially obscured by streak artifact from left ventricular
assist device. Globular low-density in the inferior aspect of the
spleen may represent intra splenic hematoma. There is a perisplenic
hematoma with small to moderate amount of blood anterior inferiorly
and tracking into the left pericolic gutter. No evidence of active
extravasation. Spleen is overall normal in size.

Adrenals/Urinary Tract: Normal adrenal glands. No hydronephrosis or
perinephric edema. Tiny hypodensity in the left kidney is too small
to accurately characterize. Symmetric excretion on delayed phase
imaging. Urinary bladder is minimally distended.

Stomach/Bowel: Small hiatal hernia. Ingested material distends the
stomach. Small bowel is unremarkable without obstruction or
inflammatory change. The appendix is normal coursing into the right
upper quadrant. Scattered colonic diverticulosis without
diverticulitis. Small to moderate colonic stool burden. No colonic
inflammation or wall thickening.

Vascular/Lymphatic: Aortic atherosclerosis. No aortic aneurysm.
Patent portal and splenic veins. Small retroperitoneal lymph nodes
not enlarged by size criteria.

Reproductive: Enlarged prostate gland spans 5.7 cm transverse.

Other: Perisplenic hematoma with blood tracking in the left
pericolic gutter. There is a small amount of blood in the right
pericolic gutter which may be related to left upper quadrant
process. Small amount of blood tracks into the pelvis. There is no
free air. Fat within both inguinal canals. No focal abscess.

Musculoskeletal: There are no acute or suspicious osseous
abnormalities. Particularly, no evidence of left rib fracture.
IMPRESSION: 1. Findings suspicious for splenic laceration superiorly, with
intraparenchymal hematoma inferiorly and perisplenic hemorrhage. No
evidence of active extravasation. If using the AAST injury scale,
this is consistent with grade 3 injury. Small amount of blood tracks
in the left pericolic gutter. Small amount of blood in the right
pericolic gutter and pelvis is likely related to left upper quadrant
process.
2. Cardiomegaly with left ventricular assist device in place.
Bilateral lower lobe atelectasis with trace left pleural effusion.
3. Colonic diverticulosis without diverticulitis.
4. Enlarged prostate gland.
5. Stable 14 mm cyst in the proximal pancreatic body.

Aortic Atherosclerosis (IASRP-XY0.0).

Critical Value/emergent results were called by telephone at the time
of interpretation on 03/06/2020 at [DATE] to Dr ABDE NOR JIHAD ,
who verbally acknowledged these results.
